# Patient Record
Sex: Female | Born: 1965
Health system: Southern US, Community
[De-identification: ages and names within clinical notes are randomized; demographics above are authoritative.]

## PROBLEM LIST (undated history)

## (undated) DIAGNOSIS — F32A Depression, unspecified: Secondary | ICD-10-CM

## (undated) DIAGNOSIS — Z8719 Personal history of other diseases of the digestive system: Secondary | ICD-10-CM

## (undated) DIAGNOSIS — R112 Nausea with vomiting, unspecified: Secondary | ICD-10-CM

## (undated) DIAGNOSIS — M1990 Unspecified osteoarthritis, unspecified site: Secondary | ICD-10-CM

## (undated) DIAGNOSIS — M542 Cervicalgia: Secondary | ICD-10-CM

## (undated) DIAGNOSIS — L709 Acne, unspecified: Secondary | ICD-10-CM

## (undated) DIAGNOSIS — Z87442 Personal history of urinary calculi: Secondary | ICD-10-CM

## (undated) DIAGNOSIS — M5126 Other intervertebral disc displacement, lumbar region: Secondary | ICD-10-CM

## (undated) DIAGNOSIS — Z973 Presence of spectacles and contact lenses: Secondary | ICD-10-CM

## (undated) DIAGNOSIS — M51369 Other intervertebral disc degeneration, lumbar region without mention of lumbar back pain or lower extremity pain: Secondary | ICD-10-CM

## (undated) DIAGNOSIS — M5136 Other intervertebral disc degeneration, lumbar region: Secondary | ICD-10-CM

## (undated) DIAGNOSIS — K631 Perforation of intestine (nontraumatic): Secondary | ICD-10-CM

## (undated) DIAGNOSIS — T7840XA Allergy, unspecified, initial encounter: Secondary | ICD-10-CM

## (undated) DIAGNOSIS — K219 Gastro-esophageal reflux disease without esophagitis: Secondary | ICD-10-CM

## (undated) DIAGNOSIS — M549 Dorsalgia, unspecified: Secondary | ICD-10-CM

## (undated) DIAGNOSIS — Z9889 Other specified postprocedural states: Secondary | ICD-10-CM

## (undated) DIAGNOSIS — J181 Lobar pneumonia, unspecified organism: Secondary | ICD-10-CM

## (undated) DIAGNOSIS — S83249A Other tear of medial meniscus, current injury, unspecified knee, initial encounter: Secondary | ICD-10-CM

## (undated) DIAGNOSIS — F419 Anxiety disorder, unspecified: Secondary | ICD-10-CM

## (undated) HISTORY — DX: Cervicalgia: M54.2

## (undated) HISTORY — DX: Acne, unspecified: L70.9

## (undated) HISTORY — PX: LITHOTRIPSY: SUR834

## (undated) HISTORY — PX: EXPLORATORY LAPAROTOMY: SUR591

## (undated) HISTORY — PX: OTHER SURGICAL HISTORY: SHX169

## (undated) HISTORY — PX: FRACTURE SURGERY: SHX138

## (undated) HISTORY — DX: Allergy, unspecified, initial encounter: T78.40XA

## (undated) HISTORY — DX: Anxiety disorder, unspecified: F41.9

## (undated) HISTORY — PX: ROTATOR CUFF REPAIR: SHX139

## (undated) HISTORY — DX: Perforation of intestine (nontraumatic): K63.1

## (undated) HISTORY — PX: ABDOMINAL HYSTERECTOMY: SHX81

## (undated) HISTORY — DX: Depression, unspecified: F32.A

## (undated) HISTORY — DX: Dorsalgia, unspecified: M54.9

## (undated) HISTORY — PX: APPENDECTOMY: SHX54

## (undated) HISTORY — PX: SPINE SURGERY: SHX786

## (undated) HISTORY — DX: Lobar pneumonia, unspecified organism: J18.1

## (undated) HISTORY — PX: SMALL INTESTINE SURGERY: SHX150

---

## 1997-10-27 ENCOUNTER — Other Ambulatory Visit: Admission: RE | Admit: 1997-10-27 | Discharge: 1997-10-27 | Payer: Self-pay | Admitting: Dermatology

## 1997-12-02 ENCOUNTER — Other Ambulatory Visit: Admission: RE | Admit: 1997-12-02 | Discharge: 1997-12-02 | Payer: Self-pay | Admitting: Dermatology

## 1998-02-10 ENCOUNTER — Other Ambulatory Visit: Admission: RE | Admit: 1998-02-10 | Discharge: 1998-02-10 | Payer: Self-pay | Admitting: Obstetrics and Gynecology

## 1998-08-07 ENCOUNTER — Inpatient Hospital Stay (HOSPITAL_COMMUNITY): Admission: RE | Admit: 1998-08-07 | Discharge: 1998-08-09 | Payer: Self-pay | Admitting: Obstetrics and Gynecology

## 1999-03-02 ENCOUNTER — Other Ambulatory Visit: Admission: RE | Admit: 1999-03-02 | Discharge: 1999-03-02 | Payer: Self-pay | Admitting: Obstetrics and Gynecology

## 2000-03-28 ENCOUNTER — Other Ambulatory Visit: Admission: RE | Admit: 2000-03-28 | Discharge: 2000-03-28 | Payer: Self-pay | Admitting: Obstetrics and Gynecology

## 2002-01-27 ENCOUNTER — Encounter: Payer: Self-pay | Admitting: Urology

## 2002-01-27 ENCOUNTER — Encounter: Admission: RE | Admit: 2002-01-27 | Discharge: 2002-01-27 | Payer: Self-pay | Admitting: Urology

## 2002-01-28 ENCOUNTER — Encounter: Admission: RE | Admit: 2002-01-28 | Discharge: 2002-01-28 | Payer: Self-pay | Admitting: Urology

## 2002-01-28 ENCOUNTER — Encounter: Payer: Self-pay | Admitting: Urology

## 2002-05-14 ENCOUNTER — Other Ambulatory Visit: Admission: RE | Admit: 2002-05-14 | Discharge: 2002-05-14 | Payer: Self-pay | Admitting: Internal Medicine

## 2003-11-14 ENCOUNTER — Emergency Department (HOSPITAL_COMMUNITY): Admission: EM | Admit: 2003-11-14 | Discharge: 2003-11-14 | Payer: Self-pay | Admitting: Emergency Medicine

## 2004-06-22 ENCOUNTER — Ambulatory Visit: Payer: Self-pay | Admitting: Internal Medicine

## 2004-07-06 ENCOUNTER — Ambulatory Visit: Payer: Self-pay | Admitting: Internal Medicine

## 2004-07-11 ENCOUNTER — Encounter: Admission: RE | Admit: 2004-07-11 | Discharge: 2004-07-11 | Payer: Self-pay | Admitting: Internal Medicine

## 2004-07-25 ENCOUNTER — Ambulatory Visit (HOSPITAL_COMMUNITY): Admission: RE | Admit: 2004-07-25 | Discharge: 2004-07-25 | Payer: Self-pay | Admitting: Urology

## 2004-07-26 ENCOUNTER — Ambulatory Visit (HOSPITAL_BASED_OUTPATIENT_CLINIC_OR_DEPARTMENT_OTHER): Admission: RE | Admit: 2004-07-26 | Discharge: 2004-07-26 | Payer: Self-pay | Admitting: Urology

## 2004-08-06 ENCOUNTER — Ambulatory Visit (HOSPITAL_COMMUNITY): Admission: RE | Admit: 2004-08-06 | Discharge: 2004-08-06 | Payer: Self-pay | Admitting: Urology

## 2005-04-08 ENCOUNTER — Other Ambulatory Visit: Admission: RE | Admit: 2005-04-08 | Discharge: 2005-04-08 | Payer: Self-pay | Admitting: Obstetrics and Gynecology

## 2005-04-25 ENCOUNTER — Observation Stay (HOSPITAL_COMMUNITY): Admission: RE | Admit: 2005-04-25 | Discharge: 2005-04-26 | Payer: Self-pay | Admitting: Obstetrics and Gynecology

## 2005-04-25 ENCOUNTER — Encounter (INDEPENDENT_AMBULATORY_CARE_PROVIDER_SITE_OTHER): Payer: Self-pay | Admitting: Specialist

## 2005-04-27 ENCOUNTER — Inpatient Hospital Stay (HOSPITAL_COMMUNITY): Admission: AD | Admit: 2005-04-27 | Discharge: 2005-04-27 | Payer: Self-pay | Admitting: Obstetrics and Gynecology

## 2005-09-19 ENCOUNTER — Encounter: Admission: RE | Admit: 2005-09-19 | Discharge: 2005-09-19 | Payer: Self-pay | Admitting: Family Medicine

## 2005-10-01 ENCOUNTER — Encounter: Admission: RE | Admit: 2005-10-01 | Discharge: 2005-10-01 | Payer: Self-pay | Admitting: Family Medicine

## 2006-05-13 HISTORY — PX: OTHER SURGICAL HISTORY: SHX169

## 2006-10-14 ENCOUNTER — Ambulatory Visit: Payer: Self-pay | Admitting: Internal Medicine

## 2007-09-11 LAB — HM MAMMOGRAPHY: HM Mammogram: NORMAL

## 2008-07-30 ENCOUNTER — Ambulatory Visit: Payer: Self-pay | Admitting: Family Medicine

## 2008-12-30 ENCOUNTER — Ambulatory Visit: Payer: Self-pay | Admitting: Internal Medicine

## 2008-12-30 DIAGNOSIS — K219 Gastro-esophageal reflux disease without esophagitis: Secondary | ICD-10-CM | POA: Insufficient documentation

## 2008-12-30 DIAGNOSIS — N809 Endometriosis, unspecified: Secondary | ICD-10-CM | POA: Insufficient documentation

## 2008-12-30 DIAGNOSIS — R1310 Dysphagia, unspecified: Secondary | ICD-10-CM | POA: Insufficient documentation

## 2009-01-13 ENCOUNTER — Ambulatory Visit: Payer: Self-pay | Admitting: Gastroenterology

## 2009-01-13 DIAGNOSIS — F3289 Other specified depressive episodes: Secondary | ICD-10-CM | POA: Insufficient documentation

## 2009-01-13 DIAGNOSIS — K5900 Constipation, unspecified: Secondary | ICD-10-CM | POA: Insufficient documentation

## 2009-01-13 DIAGNOSIS — R143 Flatulence: Secondary | ICD-10-CM

## 2009-01-13 DIAGNOSIS — R142 Eructation: Secondary | ICD-10-CM

## 2009-01-13 DIAGNOSIS — R141 Gas pain: Secondary | ICD-10-CM | POA: Insufficient documentation

## 2009-01-13 DIAGNOSIS — F329 Major depressive disorder, single episode, unspecified: Secondary | ICD-10-CM | POA: Insufficient documentation

## 2009-01-13 DIAGNOSIS — R1319 Other dysphagia: Secondary | ICD-10-CM | POA: Insufficient documentation

## 2009-01-13 DIAGNOSIS — R110 Nausea: Secondary | ICD-10-CM | POA: Insufficient documentation

## 2009-01-13 DIAGNOSIS — F411 Generalized anxiety disorder: Secondary | ICD-10-CM | POA: Insufficient documentation

## 2009-01-13 DIAGNOSIS — Z87442 Personal history of urinary calculi: Secondary | ICD-10-CM | POA: Insufficient documentation

## 2009-01-30 ENCOUNTER — Ambulatory Visit: Payer: Self-pay | Admitting: Gastroenterology

## 2009-01-30 ENCOUNTER — Encounter: Payer: Self-pay | Admitting: Gastroenterology

## 2009-02-01 ENCOUNTER — Encounter: Payer: Self-pay | Admitting: Gastroenterology

## 2009-09-20 ENCOUNTER — Telehealth (INDEPENDENT_AMBULATORY_CARE_PROVIDER_SITE_OTHER): Payer: Self-pay | Admitting: *Deleted

## 2009-09-22 ENCOUNTER — Telehealth: Payer: Self-pay | Admitting: Gastroenterology

## 2010-06-14 NOTE — Progress Notes (Signed)
  Phone Note Outgoing Call   Call placed by: Ok Anis CMA,  Sep 22, 2009 8:54 AM Call placed to: Insurer Summary of Call: I received via fax prior authorization for patient to have Dexilant....Marland KitchenMarland KitchenI called [employer] at [phone] and spoke with Alexia Freestone and she stated that patients insurance was terminated on 07-10-2009...Marland KitchenMarland KitchenI tried to reach patient with this information, left message for patient to call office
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
## 2010-06-14 NOTE — Progress Notes (Signed)
Summary: Refill Dexilant  Phone Note From Pharmacy   Caller: [facility] # [id number]* Summary of Call: Refill requested for Dexilant. Initial call taken by: Ashok Cordia RN,  Sep 20, 2009 4:10 PM    Prescriptions: DEXILANT 60 MG CPDR (DEXLANSOPRAZOLE) take one by mouth once daily  #30 x 6   Entered by:   Ashok Cordia RN   Authorized by:   Mardella Layman MD [facility]   Signed by:   Ashok Cordia RN on 09/20/2009   Method used:   Electronically to        [facility] # [id number]* (retail)       [address]       [place], [place]  [address]       Ph: [phone]       Fax: [phone]   RxID:   [id number]

## 2010-06-14 NOTE — Assessment & Plan Note (Signed)
Summary: SORE THROAT/SCM   Vital Signs:  Patient profile:   45 year old female Weight:      175 pounds Temp:     97.9 degrees F oral Pulse rate:   76 / minute BP sitting:   110 / 78  (left arm) Cuff size:   large  Vitals Entered By: Shonna Chock (July 30, 2008 9:51 AM) Comments SORE THROAT   History of Present Illness: 45 year old female presents   ST laryngitis coughing earache Taking a lot of Dayquil No fever Hurts to drink anything Ate yesterday  REVIEW OF SYSTEMS GEN: No acute illnesses, no fever, chills, sweats. CV: No chest pain or SOB GI: No noted N or V Otherwise, pertinent positives and negatives are noted in the HPI. Additional ROS may be included in the Centricity ROS section, but if not, then this constitutes the ROS.   Gen: WDWN, NAD; A & O x3, cooperative. Pleasant.Globally Non-toxic HEENT: Normocephalic and atraumatic. Throat clear, w/o exudate, R TM clear, L TM - good landmarks, No fluid present. no  rhinnorhea. No frontal or maxillary sinus T. MMM NECK: Anterior cervical  LAD is absent CV: RRR, No M/G/R, cap refill <2 sec PULM: Breathing comfortably in no respiratory distress. no wheezing, crackles, rhonchi ABD: S,NT,ND,+BS. No HSM. No rebound. EXT: No c/c/e PSYCH: Friendly, good eye contact MSK: Nml gait   Allergies (verified): No Known Drug Allergies   Impression & Recommendations:  Problem # 1:  LARYNGITIS-ACUTE (ICD-464.00) Assessment New Discussed laryngitis almost always viral  Supportive care, fluids, sleep, Dayquil, Nyquil Magic Mouthwash as needed   Strep neg  Complete Medication List: 1)  Magic Mouthwash  .... 1 tsp gargle as needed sore throat. mix 80 cc each lidocaine 2% viscous, maalox, benadryl susp.  Other Orders: Rapid Strep (40981) Prescriptions: MAGIC MOUTHWASH 1 tsp gargle as needed sore throat. Mix 80 cc each Lidocaine 2% viscous, Maalox, Benadryl susp.  #240 cc x 0   Entered and Authorized by:   Hannah Beat  MD   Signed by:   Hannah Beat MD on 07/30/2008   Method used:   Print then Give to Patient   RxID:   [id number]

## 2010-06-14 NOTE — Procedures (Signed)
Summary: Endoscopy   EGD  Procedure date:  01/30/2009  Findings:      Location: [facility]   ENDOSCOPY PROCEDURE REPORT  PATIENT:  [name] [name], [name] [name]  MR#:  [id number] BIRTHDATE:   [birth year], 45 yrs. old   GENDER:   female  ENDOSCOPIST:   Vania Rea. Jarold Motto, MD, [facility] Referred by:   PROCEDURE DATE:  01/30/2009 PROCEDURE:  EGD with biopsy, Elease Hashimoto Dilation of Esophagus ASA CLASS:   Class I INDICATIONS: REFLUX AND A GLOBUS SENSATION.  MEDICATIONS:    Fentanyl 50 mcg IV, Versed 5 mg IV TOPICAL ANESTHETIC:   Exactacain Spray  DESCRIPTION OF PROCEDURE:   After the risks benefits and alternatives of the procedure were thoroughly explained, informed consent was obtained.  The [facility] [id number]-H180 [id number] endoscope was introduced through the mouth and advanced to the second portion of the duodenum, without limitations.  The instrument was slowly withdrawn as the mucosa was fully examined. <<PROCEDUREIMAGES>>      <<OLD IMAGES>>  A hiatal hernia was found. 4CM HH NOTED.NO DEFINITE STRICTURE.#89F MALONEY DILATOR PASSED WITHOUT DIFFICULTY.  The duodenal bulb was normal in appearance, as was the postbulbar duodenum.  Normal GE junction was noted.  The esophagus and gastroesophageal junction were completely normal in appearance. RANDOM BIOPSIES DONE.  The stomach was entered and closely examined. The antrum, angularis, and lesser curvature were well visualized, including a retroflexed view of the cardia and fundus. The stomach wall was normally distensable. The scope passed easily through the pylorus into the duodenum.    Retroflexed views revealed a hiatal hernia.    The scope was then withdrawn from the patient and the procedure completed.  COMPLICATIONS:   None  ENDOSCOPIC IMPRESSION:  1) Hiatal hernia  2) Normal duodenum  3) Normal GE junction  4) Normal esophagus  5) Normal stomach  R/O EOSINOPHILIC ESOPHAGITIS VS. OCCULT STRICTURE FROM GERD. RECOMMENDATIONS:  1) await  biopsy results  2) continue current medications  CONSIDER MANOMETRY AND 24H pH STUDY.  REPEAT EXAM:   No   _______________________________ Vania Rea. Jarold Motto, MD, [facility]    CC: Madelin Headings, MD       REPORT OF SURGICAL PATHOLOGY   Case #: [id number] Patient Name: [name] [name], [name] [name]. Office Chart Number:  N/A [id number] MRN: [id number] Pathologist: Beulah Gandy. Luisa Hart, MD DOB/Age  October 18, 1965 (Age: 45)    Gender: F Date Taken:  01/30/2009 Date Received: 01/31/2009   FINAL DIAGNOSIS   ***MICROSCOPIC EXAMINATION AND DIAGNOSIS***   ESOPHAGUS, BIOPSIES:  BENIGN ESOPHAGEAL MUCOSA.  NO FEATURES OF EOSINOPHILIC ESOPHAGITIS, INTESTINAL METAPLASIA, DYSPLASIA OR MALIGNANCY IDENTIFIED.   COMMENT An Alcian Blue stain is performed to determine the presence of intestinal metaplasia (goblet cell metaplasia). No intestinal metaplasia (goblet cell metaplasia) is identified with the Alcian Blue stain. The control stained appropriately.  (JDP:kv 02-01-09)   kv Date Reported:  02/01/2009     Beulah Gandy. Luisa Hart, MD *** Electronically Signed Out By JDP ***     February 01, 2009 MRN: [id number]    [facility] [address] [place], [place]  [address]    Dear Ms. Jaskot,  I am pleased to inform you that the biopsies taken during your recent endoscopic examination did not show any evidence of cancer upon pathologic examination.  Additional information/recommendations:  __No further action is needed at this time.  Please follow-up with      your primary care physician for your other healthcare needs.  __ Please call [phone] to schedule a return visit to review  your condition.  _XX_ Continue with the treatment plan as outlined on the day of your      exam.  __ You should have a repeat endoscopic examination for this problem              in _ months/years.   Please call us if you are having persistent problems or have questions about your condition that have not been fully  answered at this time.  Sincerely,  Mardella Layman MD [facility]  This letter has been electronically signed by your physician.   This report was created from the original endoscopy report, which was reviewed and signed by the above listed endoscopist.

## 2010-06-14 NOTE — Letter (Signed)
Summary: Patient [facility] Biopsy Results  [facility]  [address] [place], [place] [address]   Phone: [phone]  Fax: [phone]        February 01, 2009 MRN: [id number]    [facility] [address] [place], [place]  [address]    Dear Ms. Clagg,  I am pleased to inform you that the biopsies taken during your recent endoscopic examination did not show any evidence of cancer upon pathologic examination.  Additional information/recommendations:  __No further action is needed at this time.  Please follow-up with      your primary care physician for your other healthcare needs.  __ Please call [phone] to schedule a return visit to review      your condition.  _XX_ Continue with the treatment plan as outlined on the day of your      exam.  __ You should have a repeat endoscopic examination for this problem              in _ months/years.   Please call us if you are having persistent problems or have questions about your condition that have not been fully answered at this time.  Sincerely,  Mardella Layman MD [facility]  This letter has been electronically signed by your physician.
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
## 2010-07-12 ENCOUNTER — Encounter (HOSPITAL_COMMUNITY)
Admission: RE | Admit: 2010-07-12 | Discharge: 2010-07-12 | Disposition: A | Discharge status: Home / Self Care | Payer: BC Managed Care – PPO | Source: Ambulatory Visit | Attending: Obstetrics and Gynecology | Admitting: Obstetrics and Gynecology

## 2010-07-12 LAB — CBC
HCT: 40 % (ref 36.0–46.0)
Hemoglobin: 13.1 g/dL (ref 12.0–15.0)
MCH: 32.3 pg (ref 26.0–34.0)
MCHC: 32.8 g/dL (ref 30.0–36.0)
MCV: 98.5 fL (ref 78.0–100.0)
Platelets: 180 10*3/uL (ref 150–400)
RBC: 4.06 MIL/uL (ref 3.87–5.11)
RDW: 11.8 % (ref 11.5–15.5)
WBC: 6.1 10*3/uL (ref 4.0–10.5)

## 2010-07-12 LAB — SURGICAL PCR SCREEN
MRSA, PCR: NEGATIVE
Staphylococcus aureus: NEGATIVE

## 2010-07-18 ENCOUNTER — Ambulatory Visit (HOSPITAL_COMMUNITY)
Admission: RE | Admit: 2010-07-18 | Discharge: 2010-07-18 | Disposition: A | Discharge status: Home / Self Care | Payer: BC Managed Care – PPO | Source: Ambulatory Visit | Attending: Obstetrics and Gynecology | Admitting: Obstetrics and Gynecology

## 2010-07-18 ENCOUNTER — Other Ambulatory Visit: Payer: Self-pay | Admitting: Obstetrics and Gynecology

## 2010-07-18 DIAGNOSIS — Z01812 Encounter for preprocedural laboratory examination: Secondary | ICD-10-CM | POA: Insufficient documentation

## 2010-07-18 DIAGNOSIS — N949 Unspecified condition associated with female genital organs and menstrual cycle: Secondary | ICD-10-CM | POA: Insufficient documentation

## 2010-07-18 DIAGNOSIS — Z01818 Encounter for other preprocedural examination: Secondary | ICD-10-CM | POA: Insufficient documentation

## 2010-07-18 DIAGNOSIS — Z90710 Acquired absence of both cervix and uterus: Secondary | ICD-10-CM | POA: Insufficient documentation

## 2010-07-23 NOTE — H&P (Signed)
  [name] [name]               ACCOUNT NO.:  [id number]  MEDICAL RECORD NO.:  [id number]        PATIENT TYPE:  WAMB  LOCATION:                                [facility]  [facility]  PHYSICIAN:  [name] [name]. [name] [name], CarrDATE OF BIRTH:  [birth year]  DATE OF ADMISSION: DATE OF DISCHARGE:                             HISTORY & PHYSICAL   DATE OF SCHEDULED SURGERY:  July 17, 2010  CHIEF COMPLAINT:  Chronic pelvic pain.  HISTORY OF PRESENT ILLNESS:  A 45 year old who has had a prior TAH followed on a separate occasion by laparoscopic LSO.  The last procedure was December 2006, when she had diagnostic laparoscopy with laparoscopic LSO and lysis of adhesions.  The right ovary appeared to be normal at that time.  Over the past 6 months, she has developed worsening problems with right lower quadrant pain and presents now for definitive RSO.  The procedure including risk of bleeding, infection, the possibility of open surgery to complete by laparotomy, ERT along with her expected recovery time all reviewed with her which she understands and accepts.  Ultrasound dated June 06, 2010 showed no free fluid and no enlargements noted.  The most likely possibility is periadnexal adhesions on that side.  PAST MEDICAL HISTORY:  ALLERGIES:  ZOLOFT.  CURRENT MEDICATIONS:  Evamist, Vivelle-Dot, Pepcid p.r.n.  PAST SURGICAL HISTORY:  Hysterectomy in 2000, LSO in 2006.  She has had 2 vaginal deliveries, lithotripsy, upper endoscopy, and esophageal dilatation.  FAMILY HISTORY:  Significant for heart disease, anemia, thyroid disease, gallbladder disease, arthritis, hypertension, and unspecified cancer.  SOCIAL HISTORY:  Dr. Fabian [name] is her medical doctor.  She is married. Denies tobacco or drug use, one alcoholic drink per day.  PHYSICAL EXAMINATION:  VITAL SIGNS:  Temperature 98.2, blood pressure 120/78. HEENT:  Unremarkable. NECK:  Supple, without masses. LUNGS:  Clear. CARDIOVASCULAR:  Regular,  rate and rhythm without murmurs, rubs or gallops. BREASTS:  Without masses. ABDOMEN:  Soft and nontender. PELVIC:  Normal external genitalia.  High vaginal swab is clear. Vaginal cuff intact.  Bimanual negative.  No unusual masses or tenderness.  IMPRESSION:  Chronic pelvic pain, prior TAH/LSO on separate occasions.  PLAN:  DO with RSO, possible EO with RSO.  Procedure and risks reviewed as above.     [name] [name] M. [name] [name], M.D.     RMH/MEDQ  D:  07/17/2010  T:  07/17/2010  Job:  [id number]  Electronically Signed by [name] [name] M.D. on 07/23/2010 08:40:14 PM

## 2010-07-23 NOTE — Op Note (Signed)
  [name] [name], [name] [name]               ACCOUNT NO.:  [id number]  MEDICAL RECORD NO.:  [id number]           PATIENT TYPE:  O  LOCATION:  [facility]                          [facility]ILITY:  [facility]  PHYSICIAN:  Duke Salvia. Marcelle Overlie, M.D.DATE OF BIRTH:  1965-05-22  DATE OF PROCEDURE:  07/18/2010 DATE OF DISCHARGE:  07/18/2010                              OPERATIVE REPORT   PREOPERATIVE DIAGNOSIS:  Chronic pelvic pain.  POSTOPERATIVE DIAGNOSIS:  Chronic pelvic pain, right adnexal adhesions.  PROCEDURE:  Diagnostic laparoscopy, right salpingo-oophorectomy.  SURGEON:  Duke Salvia. Marcelle Overlie, MD  ANESTHESIA:  General.  COMPLICATIONS:  None.  DRAINS:  Foley catheter.  BLOOD LOSS:  Minimal.  PROCEDURE AND FINDINGS:  The patient was taken to the operating room. After an adequate level of general endotracheal anesthesia was obtained with the patient's legs in stirrups, the abdomen was prepped and draped in usual manner for sterile abdominal procedures.  Foley catheter was positioned draining clear urine.  The subumbilical area was infiltrated with 0.25% Marcaine plain.  A small incision was made, the Veress needle was introduced without difficulty.  Its intra-abdominal position verified by pressure and water testing.  After 2-1/2 liter pneumoperitoneum was then created, laparoscopic trocar and sleeve were then introduced without difficulty.  A 10/12 bladed trocar was inserted three fingerbreadths above the symphysis in the midline under direct visualization.  The patient was placed in Trendelenburg, and the pelvic findings as follows: Upper abdomen unremarkable.  In the pelvis, the uterus and left ovary were surgically absent.  The right ovary was normal in size, had some moderate adhesions to the right pelvic sidewall.  A grasping instrument was used to grasp the tube and ovary, placed it on traction toward the midline.  The course of the ureter was evaluated and noted to be well below.  The EnSeal  device was used to coagulate and divide the right IP ligament down to and including the right round ligament.  With the right tube and ovary still on traction toward the midline, the course of the ureter was reassessed and noted to be well below, staying close to the ovary.  The remaining adhesions were coagulated and divided with EnSeal. The specimen was then removed in two pieces through the lower incision and sent to pathology.  Excellent hemostasis once the specimen was removed, the course of the ureter was observed again and noted to be well below the operative site.  Instruments were removed, gas allowed to escape.  Defects were closed with 4-0 Dexon subcuticular sutures and Dermabond.  She tolerated this well, went to recovery room in good condition.     [name] M. Marcelle Overlie, M.D.     RMH/MEDQ  D:  07/18/2010  T:  07/18/2010  Job:  [id number]  Electronically Signed by Richarda Overlie M.D. on 07/23/2010 08:40:18 PM

## 2010-07-29 ENCOUNTER — Other Ambulatory Visit: Payer: Self-pay | Admitting: General Surgery

## 2010-07-29 ENCOUNTER — Inpatient Hospital Stay (HOSPITAL_COMMUNITY): Payer: BC Managed Care – PPO

## 2010-07-29 ENCOUNTER — Inpatient Hospital Stay (HOSPITAL_COMMUNITY)
Admission: AD | Admit: 2010-07-29 | Discharge: 2010-07-29 | Disposition: A | Discharge status: Other Acute Inpatient Hospital | Payer: BC Managed Care – PPO | Source: Ambulatory Visit | Attending: Obstetrics and Gynecology | Admitting: Obstetrics and Gynecology

## 2010-07-29 ENCOUNTER — Inpatient Hospital Stay (HOSPITAL_COMMUNITY)
Admission: EM | Admit: 2010-07-29 | Discharge: 2010-08-09 | DRG: 585 | Disposition: A | Discharge status: Home / Self Care | Payer: BC Managed Care – PPO | Attending: Pulmonary Disease | Admitting: Pulmonary Disease

## 2010-07-29 DIAGNOSIS — K37 Unspecified appendicitis: Secondary | ICD-10-CM | POA: Diagnosis present

## 2010-07-29 DIAGNOSIS — K389 Disease of appendix, unspecified: Secondary | ICD-10-CM | POA: Diagnosis present

## 2010-07-29 DIAGNOSIS — K219 Gastro-esophageal reflux disease without esophagitis: Secondary | ICD-10-CM | POA: Diagnosis present

## 2010-07-29 DIAGNOSIS — K631 Perforation of intestine (nontraumatic): Principal | ICD-10-CM | POA: Diagnosis present

## 2010-07-29 DIAGNOSIS — K658 Other peritonitis: Secondary | ICD-10-CM | POA: Diagnosis present

## 2010-07-29 DIAGNOSIS — R1031 Right lower quadrant pain: Secondary | ICD-10-CM | POA: Insufficient documentation

## 2010-07-29 DIAGNOSIS — Z90710 Acquired absence of both cervix and uterus: Secondary | ICD-10-CM

## 2010-07-29 LAB — COMPREHENSIVE METABOLIC PANEL
ALT: 31 U/L (ref 0–35)
AST: 22 U/L (ref 0–37)
Albumin: 4.3 g/dL (ref 3.5–5.2)
Alkaline Phosphatase: 44 U/L (ref 39–117)
BUN: 15 mg/dL (ref 6–23)
CO2: 25 mEq/L (ref 19–32)
Calcium: 9.4 mg/dL (ref 8.4–10.5)
Chloride: 108 mEq/L (ref 96–112)
Creatinine, Ser: 0.74 mg/dL (ref 0.4–1.2)
GFR calc Af Amer: >60 mL/min (ref >60)
GFR calc non Af Amer: >60 mL/min (ref >60)
Glucose, Bld: 103 mg/dL — ABNORMAL HIGH (ref 70–99)
Potassium: 3.8 mEq/L (ref 3.5–5.1)
Sodium: 140 mEq/L (ref 135–145)
Total Bilirubin: 0.3 mg/dL (ref 0.3–1.2)
Total Protein: 7.2 g/dL (ref 6.0–8.3)

## 2010-07-29 LAB — CBC
HCT: 42.6 % (ref 36.0–46.0)
Hemoglobin: 14.4 g/dL (ref 12.0–15.0)
MCH: 32.5 pg (ref 26.0–34.0)
MCHC: 33.8 g/dL (ref 30.0–36.0)
MCV: 96.2 fL (ref 78.0–100.0)
Platelets: 182 10*3/uL (ref 150–400)
RBC: 4.43 MIL/uL (ref 3.87–5.11)
RDW: 11.7 % (ref 11.5–15.5)
WBC: 18.5 10*3/uL — ABNORMAL HIGH (ref 4.0–10.5)

## 2010-07-29 MED ORDER — IOHEXOL 300 MG/ML  SOLN
100.0000 mL | Freq: Once | INTRAMUSCULAR | Status: AC | PRN
  Administered 2010-07-29: 100 mL via INTRAVENOUS

## 2010-07-30 LAB — BASIC METABOLIC PANEL
BUN: 8 mg/dL (ref 6–23)
CO2: 28 mEq/L (ref 19–32)
Calcium: 7.8 mg/dL — ABNORMAL LOW (ref 8.4–10.5)
Chloride: 108 mEq/L (ref 96–112)
Creatinine, Ser: 0.81 mg/dL (ref 0.4–1.2)
GFR calc Af Amer: >60 mL/min (ref >60)
GFR calc non Af Amer: >60 mL/min (ref >60)
Glucose, Bld: 155 mg/dL — ABNORMAL HIGH (ref 70–99)
Potassium: 3.9 mEq/L (ref 3.5–5.1)
Sodium: 140 mEq/L (ref 135–145)

## 2010-07-30 LAB — CBC
HCT: 33.9 % — ABNORMAL LOW (ref 36.0–46.0)
Hemoglobin: 11.4 g/dL — ABNORMAL LOW (ref 12.0–15.0)
MCH: 32.9 pg (ref 26.0–34.0)
MCHC: 33.6 g/dL (ref 30.0–36.0)
MCV: 97.7 fL (ref 78.0–100.0)
Platelets: 161 10*3/uL (ref 150–400)
RBC: 3.47 MIL/uL — ABNORMAL LOW (ref 3.87–5.11)
RDW: 11.8 % (ref 11.5–15.5)
WBC: 19.5 10*3/uL — ABNORMAL HIGH (ref 4.0–10.5)

## 2010-07-30 NOTE — H&P (Signed)
  NAMEJEANELL, [name] [name]               ACCOUNT NO.:  [id number]  MEDICAL RECORD NO.:  [id number]          PATIENT TYPE:  LOCATION:                                 FACILITY:  PHYSICIAN:  Duke Salvia. Marcelle Overlie, M.D.DATE OF BIRTH:  August 17, 1965  DATE OF ADMISSION: DATE OF DISCHARGE:                             HISTORY & PHYSICAL   PRIMARY CARE PHYSICIAN:  Neta Mends. Panosh, MD  CHIEF COMPLAINT:  Chronic right lower quadrant pain.  HISTORY OF PRESENT ILLNESS:  A 45 year old G2, P2.  In 2000, she had a TVH and in 2006, she had diagnostic laparoscopy, lysis of adhesions and LSO.  The right tube and ovary appeared to be normal at that time.  In 2009, her FSH was 101 and she has been on estradiol 1 mg daily for vasomotor symptoms.  Over the last 6 months, has noted worsening problems with right lower quadrant pain.  Recent ultrasound in our office on June 06, 2010, demonstrated no enlargement of the right ovary.  Due to chronic pain, presents for laparoscopic RSO.  PAST MEDICAL HISTORY:  ALLERGIES:  Zoloft.  CURRENT MEDICATIONS:  Meloxicam for lower back pain, OTC Pepcid, and estradiol 1 mg daily.  SURGICAL HISTORY:  She had two vaginal births, TVH in 2000, [place] in 2006. Lithotripsy on two occasions and has had laparoscopy for laser surgery and in 90s for pelvic endometriosis.  FAMILY HISTORY:  Significant for thyroid cancer and migraine headache.  SOCIAL HISTORY:  Denies tobacco or drug use.  She does have two alcoholic drinks per week.  She is married.  PHYSICAL EXAMINATION:  VITAL SIGNS:  Temperature 98.2, blood pressure 120/78. HEENT:  Unremarkable. NECK:  Supple without masses. LUNGS:  Clear. CARDIOVASCULAR:  Rate and rhythm without murmurs, rubs, or gallops. BREASTS:  Without masses. ABDOMEN:  Soft, flat, nontender. PELVIC:  Normal external genitalia.  Vaginal cuff clear.  Bimanual revealed tenderness on the right.  No nodularity noted. EXTREMITIES:   Unremarkable. NEUROLOGIC:  Unremarkable.  IMPRESSION:  Chronic pelvic pain, prior total vaginal hysterectomy, left salpingo-oophorectomy.  PLAN:  Laparoscopic RSO.  Procedure and risks reviewed as above.     Jadarion Halbig M. Marcelle Overlie, M.D.     RMH/MEDQ  D:  06/29/2010  T:  06/29/2010  Job:  [id number]  Electronically Signed by Richarda Overlie M.D. on 07/30/2010 09:26:51 AM

## 2010-07-30 NOTE — Op Note (Signed)
[name] [name], [name] [name]               ACCOUNT NO.:  [id number]  MEDICAL RECORD NO.:  [id number]           PATIENT TYPE:  I  LOCATION:  1235                         FACILITY:  [facility]  PHYSICIAN:  Angelia Mould. Derrell Lolling, M.D.DATE OF BIRTH:  1965/12/27  DATE OF PROCEDURE:  07/29/2010 DATE OF DISCHARGE:                              OPERATIVE REPORT   PREOPERATIVE DIAGNOSES:  Acute peritonitis, suspect small bowel perforation.  POSTOPERATIVE DIAGNOSES: 1. Perforated small bowel with fecal peritonitis. 2. Secondary inflammation of appendix with multiple appendicolith.  OPERATION PERFORMED: 1. Diagnostic laparoscopy. 2. Laparotomy with closure of ileal perforation. 3. Indicated appendectomy.  SURGEON:  Angelia Mould. Derrell Lolling, M.D.  FIRST ASSISTANT:  Abigail Miyamoto, M.D.  OPERATIVE INDICATIONS:  This is a 45 year old Caucasian female in fairly good health.  She has had a hysterectomy in the past, left salpingo- oophorectomy in the past, and lithotripsy for kidney stones.  She has had an upper endoscopy with dilatation for gastroesophageal reflux disease.  She underwent elective laparoscopic right salpingo- oophorectomy on July 18, 2010 by Dr. Marcelle Overlie, and that surgery was uneventful with minimal adhesions.  She did very well after that surgery, resuming normal diet, normal bowel function, and normal activities.  She went to bed feeling well last night and awoke this morning with severe lower abdominal pain, shaking chills, and nausea, vomiting.  She was evaluated at [facility] hospital in the middle of the day, where CT scan showed a fluid collection with air bubbles in the pelvis and extravasated contrast.  She had a white count of 18,000.  She had acute abdominal pain and tenderness with peritoneal signs.  I was called to see her and we transferred her to [facility] for surgical management.  OPERATIVE FINDINGS:  The patient had a pinpoint perforation of the antimesenteric border  of the ileum about 18-24 inches proximal to the ileocecal valve.  The surrounding bowel was perfectly viable.  There was no evidence of Crohns disease. There was no mass and no visible or palpable ulcer. There was enteric contamination throughout the pelvis.  There was acute inflammatory exudate around loops of bowel and small in the pelvis, but the upper abdomen, there was some minimal contamination in cloudy fluid.  The appendix was caught up in the inflammatory process, but only appeared to be secondarily inflamed.  The appendix had 2 large fecaliths in it and therefore we decided to do appendectomy because of the high risk for appendicitis in the future.  The rest of the small bowel from the ligament of Treitz to the ileocecal valve showed no evidence of any injury.  The right colon, descending colon, sigmoid colon, and rectum showed no evidence of any primary injury.  The operative site in the right pelvis, where the tube and ovary was looked fine.  There was no hematoma or abscess, there should appear to be healing uneventfully.  The liver and gallbladderlooked normal.  The stomach and duodenum felt normal.  There is no evidence of any ulcer.  The transverse colon felt normal as did the omentum.  OPERATIVE TECHNIQUE:  Following induction of general endotracheal anesthesia, a Foley catheter  and nasogastric tube were placed.  The abdomen was prepped and draped in the sterile fashion.  Intravenous antibiotics were given preop.  The patient was identified as correct patient and correct procedure.  A 5-mm optical trocar was placed in left upper quadrant under direct vision.  The insertion was uneventful and atraumatic.  Pneumoperitoneum was created.  Video cam was inserted.  We saw that the inflammatory process with exudate and greenish murky fluid was focally in the pelvis and we saw no abnormality of the liver or gallbladder or stomach.  We then abandoned the laparoscopic approach and  removed the trocar.  A lower midline incision was made.  The fascia was incised in the midline. The abdominal cavity was entered and explored with findings as described above.  We evacuated the enteric fluid.  We looked at the small bowel perforation and because it was so focal with such healthy bowel all around it.  We simply closed it with 3 interrupted sutures of 2-0 silk placed transversely.  After this was done, the closure was quite secure, the tissues looked pretty healthy and there was no evidence of any leak. We could make small bowel content prograde and retrograde and we could actually palpate the lumen.  We then spent some time probably irrigating 10 L of saline throughout the subphrenic spaces, abdomen, paracolic gutters, and pelvis until all the irrigation fluid was completely clear.  We saw no bleeding or other pathologic process.  The appendix was caught up in the pelvic inflammation and had 2 large palpable fecaliths.  I elected to remove the appendix because of this.  The appendiceal mesentery was clamped with hemostats and divided and ligated with 2-0 Vicryl ties.  I placed a purse-string suture of 3-0 silk around the base of the appendix.  I ligated the base of the appendix with 2-0 Vicryl tie and then amputated the appendix and removed it.  The appendiceal stump was then inverted and the purse-string suture of silk was tied, this provided a very secure inversion and closure and no further sutures were necessary.  We irrigated further and I felt everything looked good.  I chose to put some Tisseel on the small bowel repair and let that harden and then put the small bowel back in its anatomic position.  I draped the omentum down over the repair.  The midline fascia was then closed with a running suture of #1 double-stranded PDS.  The skin was closed loosely with a few staples, but mostly, I placed Telfa wicks between the staples to drain the wound.  Clean bandages were  placed and the patient taken to the recovery room in stable condition.  Estimated blood loss was about 50 cc.  Complications, none.  Sponge, needle, and instrument counts were correct.     Angelia Mould. Derrell Lolling, M.D.     HMI/MEDQ  D:  07/29/2010  T:  07/30/2010  Job:  [id number]  cc:   Duke Salvia. Marcelle Overlie, M.D. Fax: [phone]  Dr. Rica Records, MD Fax: [phone]  Electronically Signed by Claud Kelp M.D. on 07/30/2010 09:36:49 AM

## 2010-07-30 NOTE — H&P (Signed)
NAMEJAYLEAH, GARBERS               ACCOUNT NO.:  [id number]  MEDICAL RECORD NO.:  [id number]           PATIENT TYPE:  I  LOCATION:  1235                         FACILITY:  [facility]  PHYSICIAN:  Angelia Mould. Derrell Lolling, M.D.DATE OF BIRTH:  21-Oct-1965  DATE OF ADMISSION:  07/29/2010 DATE OF DISCHARGE:                             HISTORY & PHYSICAL   CHIEF COMPLAINT:  Abdominal pain, chills, and vomiting.  HISTORY OF PRESENT ILLNESS:  This is a 45 year old white female who underwent an uneventful laparoscopic right salpingo-oophorectomy by Dr. Marcelle Overlie on July 18, 2010.  She did very well; resuming normal activities, diet, and bowel function.  She went to bed feeling well last night and awoke this morning with severe lower abdominal pain, it has continued to be severe and persistent, she has had chills, she has vomited.  She went to the [facility], maternity admission, and was evaluated by Dr. Renaldo Fiddler.  After her evaluation, revealed findings suspicious for small bowel perforation, I was called, and the patient was transferred to [facility] for my management.  She is being evaluated in the [facility] Emergency Room.  PAST MEDICAL HISTORY: 1. Vaginal hysterectomy, year 2000. 2. Left salpingo-oophorectomy, approximately 2006. 3. Right salpingo-oophorectomy on July 18, 2010. 4. Lithotripsy for kidney stones. 5. Gastroesophageal reflux disease, status post upper endoscopy with     dilatation. 6. L4-L5 bulging disk.  CURRENT MEDICATIONS: 1. Estradiol. 2. Pepcid daily. 3. Tylox. 4. Fish oil. 5. Multivitamins.  DRUG ALLERGIES:  ZOLOFT.  FAMILY HISTORY:  Mother living and well.  Father living, unknown medical history with all remote members in the family has had an unspecified cancer, hypertension, gallbladder disease.  SOCIAL HISTORY:  She is married.  She denies tobacco.  She drinks 1 alcoholic beverage a day.  REVIEW OF SYSTEMS:  Ten-system review of systems is  performed and is noncontributory and negative except as described above.  PHYSICAL EXAMINATION:  GENERAL:  A pleasant, cooperative, Caucasian female, in moderately extreme distress.  She is moving around in bed, cannot find a comfortable position, trying to lie on her side.  Her husband and rest of her family are at the bedside.  Color is good. VITAL SIGNS:  Temperature 98.5, heart rate 106, blood pressure 109/69, respiratory rate 16, oxygen saturation is actually not listed. EYES:  Sclerae clear.  Extraocular movements intact. EARS, NOSE, MOUTH, and THROAT:  Nose, lips, tongue, and oropharynx without gross lesions. NECK:  Supple, nontender.  No mass.  No jugular distention. LUNGS:  Clear to auscultation, anterior exam. HEART:  Regular tachycardia.  No ectopy.  No murmur.  Radial and femoral pulses are palpable. ABDOMEN:  She has a healing transverse scar in the infraumbilical area. No sign of infection or hernia there.  The abdomen is borderline obese. Softer in the upper abdomen, but very tender with involuntary guarding and percussion tenderness in the lower abdomen with peritoneal signs.  I do not feel a mass. PELVIC:  Not performed. EXTREMITIES:  She moves all 4 extremities well without pain or deformity. NEUROLOGIC:  No gross motor or sensory deficits.  ADMISSION DATA:  Hemoglobin 14.2, white  blood cells count 18,500, glucose 103, BUN 50, creatinine 0.74.  Liver function tests normal.  CT scan shows fluid, probable oral contrast and gas in the pelvis, suggesting small bowel injury.  ASSESSMENT: 1. Peritonitis with suspected small bowel perforation.  The etiology     of this is not clear.  It may or may not be related to her recent     laparoscopic surgery. 2. Status post recent laparoscopic right salpingo-oophorectomy. 3. Status post left salpingo-oophorectomy. 4. Status post vaginal hysterectomy.  PLAN:  The patient will be taken to the operating room emergently.  She  will be given fluid resuscitation, intravenous antibiotics.  I have discussed the indications and details of surgery with the patient and her husband.  Risks and possible outcomes have been discussed, including but not limited to bleeding, infection, bowel injury and bowel resection, colostomy, wound healing problems with hernia and infection, injury to adjacent organs, and other unforeseen problems.  She seems to understand these issues well.  At this time, all the questions were answered.  She and her husband are in full agreement to this plan.     Angelia Mould. Derrell Lolling, M.D.     HMI/MEDQ  D:  07/29/2010  T:  07/30/2010  Job:  [id number]  cc:   Duke Salvia. Marcelle Overlie, M.D. Fax: [phone]  Zelphia Cairo, MD Fax: [phone]  Neta Mends. Fabian Sharp, MD [address] [place], [place] [address]  Electronically Signed by Claud Kelp M.D. on 07/30/2010 09:37:34 AM

## 2010-07-31 LAB — CBC
HCT: 31.5 % — ABNORMAL LOW (ref 36.0–46.0)
Hemoglobin: 10.1 g/dL — ABNORMAL LOW (ref 12.0–15.0)
MCH: 32.2 pg (ref 26.0–34.0)
MCHC: 32.1 g/dL (ref 30.0–36.0)
MCV: 100.3 fL — ABNORMAL HIGH (ref 78.0–100.0)
Platelets: 156 10*3/uL (ref 150–400)
RBC: 3.14 MIL/uL — ABNORMAL LOW (ref 3.87–5.11)
RDW: 11.9 % (ref 11.5–15.5)
WBC: 14.4 10*3/uL — ABNORMAL HIGH (ref 4.0–10.5)

## 2010-07-31 LAB — BASIC METABOLIC PANEL
BUN: 7 mg/dL (ref 6–23)
CO2: 27 mEq/L (ref 19–32)
Calcium: 7.8 mg/dL — ABNORMAL LOW (ref 8.4–10.5)
Chloride: 107 mEq/L (ref 96–112)
Creatinine, Ser: 0.81 mg/dL (ref 0.4–1.2)
GFR calc Af Amer: >60 mL/min (ref >60)
GFR calc non Af Amer: >60 mL/min (ref >60)
Glucose, Bld: 117 mg/dL — ABNORMAL HIGH (ref 70–99)
Potassium: 3.7 mEq/L (ref 3.5–5.1)
Sodium: 138 mEq/L (ref 135–145)

## 2010-07-31 LAB — BODY FLUID CULTURE

## 2010-08-01 LAB — BASIC METABOLIC PANEL
BUN: 6 mg/dL (ref 6–23)
CO2: 28 mEq/L (ref 19–32)
Calcium: 8 mg/dL — ABNORMAL LOW (ref 8.4–10.5)
Chloride: 108 mEq/L (ref 96–112)
Creatinine, Ser: 0.79 mg/dL (ref 0.4–1.2)
GFR calc Af Amer: >60 mL/min (ref >60)
GFR calc non Af Amer: >60 mL/min (ref >60)
Glucose, Bld: 88 mg/dL (ref 70–99)
Potassium: 3.8 mEq/L (ref 3.5–5.1)
Sodium: 140 mEq/L (ref 135–145)

## 2010-08-01 LAB — CBC
HCT: 30.3 % — ABNORMAL LOW (ref 36.0–46.0)
Hemoglobin: 9.6 g/dL — ABNORMAL LOW (ref 12.0–15.0)
MCH: 32 pg (ref 26.0–34.0)
MCHC: 31.7 g/dL (ref 30.0–36.0)
MCV: 101 fL — ABNORMAL HIGH (ref 78.0–100.0)
Platelets: 141 10*3/uL — ABNORMAL LOW (ref 150–400)
RBC: 3 MIL/uL — ABNORMAL LOW (ref 3.87–5.11)
RDW: 11.6 % (ref 11.5–15.5)
WBC: 7.8 10*3/uL (ref 4.0–10.5)

## 2010-08-03 LAB — ANAEROBIC CULTURE

## 2010-08-04 LAB — BASIC METABOLIC PANEL
BUN: 3 mg/dL — ABNORMAL LOW (ref 6–23)
CO2: 27 mEq/L (ref 19–32)
Calcium: 8.5 mg/dL (ref 8.4–10.5)
Chloride: 105 mEq/L (ref 96–112)
Creatinine, Ser: 0.72 mg/dL (ref 0.4–1.2)
GFR calc Af Amer: >60 mL/min (ref >60)
GFR calc non Af Amer: >60 mL/min (ref >60)
Glucose, Bld: 112 mg/dL — ABNORMAL HIGH (ref 70–99)
Potassium: 3.7 mEq/L (ref 3.5–5.1)
Sodium: 139 mEq/L (ref 135–145)

## 2010-08-04 LAB — CBC
HCT: 32.9 % — ABNORMAL LOW (ref 36.0–46.0)
Hemoglobin: 10.6 g/dL — ABNORMAL LOW (ref 12.0–15.0)
MCH: 31 pg (ref 26.0–34.0)
MCHC: 32.2 g/dL (ref 30.0–36.0)
MCV: 96.2 fL (ref 78.0–100.0)
Platelets: 211 10*3/uL (ref 150–400)
RBC: 3.42 MIL/uL — ABNORMAL LOW (ref 3.87–5.11)
RDW: 11 % — ABNORMAL LOW (ref 11.5–15.5)
WBC: 8 10*3/uL (ref 4.0–10.5)

## 2010-08-04 LAB — MAGNESIUM: Magnesium: 2 mg/dL (ref 1.5–2.5)

## 2010-08-06 LAB — CBC
HCT: 36.1 % (ref 36.0–46.0)
Hemoglobin: 11.8 g/dL — ABNORMAL LOW (ref 12.0–15.0)
MCH: 31.3 pg (ref 26.0–34.0)
MCHC: 32.7 g/dL (ref 30.0–36.0)
MCV: 95.8 fL (ref 78.0–100.0)
Platelets: 274 10*3/uL (ref 150–400)
RBC: 3.77 MIL/uL — ABNORMAL LOW (ref 3.87–5.11)
RDW: 11 % — ABNORMAL LOW (ref 11.5–15.5)
WBC: 7.4 10*3/uL (ref 4.0–10.5)

## 2010-08-06 LAB — BASIC METABOLIC PANEL
BUN: 9 mg/dL (ref 6–23)
CO2: 27 mEq/L (ref 19–32)
Calcium: 8.8 mg/dL (ref 8.4–10.5)
Chloride: 104 mEq/L (ref 96–112)
Creatinine, Ser: 0.88 mg/dL (ref 0.4–1.2)
GFR calc Af Amer: >60 mL/min (ref >60)
GFR calc non Af Amer: >60 mL/min (ref >60)
Glucose, Bld: 93 mg/dL (ref 70–99)
Potassium: 3.9 mEq/L (ref 3.5–5.1)
Sodium: 140 mEq/L (ref 135–145)

## 2010-08-09 LAB — WOUND CULTURE

## 2010-08-10 HISTORY — PX: OTHER SURGICAL HISTORY: SHX169

## 2010-08-31 NOTE — Discharge Summary (Signed)
[name] [name], [name] [name]               ACCOUNT NO.:  [id number]  MEDICAL RECORD NO.:  [id number]           PATIENT TYPE:  I  LOCATION:  1536                         FACILITY:  [facility]  PHYSICIAN:  Velora Heckler, MD      DATE OF BIRTH:  [birth year]  DATE OF ADMISSION:  07/29/2010 DATE OF DISCHARGE:  08/09/2010                        DISCHARGE SUMMARY - REFERRING   ADMISSION DIAGNOSES: 1. Acute peritonitis with perforated small bowel fecal peritonitis. 2. Inflammation of the appendix with multiple appendicoliths.  DISCHARGE DIAGNOSES: 1. Acute peritonitis with perforated small bowel fecal peritonitis. 2. Inflammation of the appendix with multiple appendicoliths. 3. Postop ileus before. 4. Postop wound drainage. 5. Gastroesophageal reflux disease. 6. History of nephrolithiasis with lithotripsy. 7. Status post vaginal hysterectomy; left salpingo-oophorectomy in     2006; right salpingo-oophorectomy on July 18, 2010. 8. L4-L6 bulging disk disease.  PROCEDURES: 1. CT of the abdomen and pelvis, July 29, 2010, shows a large     collection in the pelvis, containing hyperdense material, likely     secondary to perforated small bowel.  There is free intraperitoneal     gas anterior to the liver, not consistent with recent laparotomy.     Liver gallbladder, spleen, adrenals, pancreas were all within     normal limits. 2. Diagnostic laparoscopy, laparotomy with closure of ileal     perforation and indicated appendectomy, July 29, 2010, Dr. Derrell Lolling.  BRIEF HISTORY:  The patient is a 45 year old white female who recently underwent uneventful laparoscopic right salpingo-oophorectomy by Dr. Marcelle Overlie on July 18, 2010.  She did well, was resuming normal activities and bowel function.  She went to bed feeling well last night and awoke following morning with severe lower abdominal pain which continued to be severe and persistent.  She had chills and vomited.  She was seen at [facility],  maternity admission was evaluated by Dr. Renaldo Fiddler. After evaluation, suspicious for a small bowel perforation, Dr. Derrell Lolling was contacted.  The patient was transferred to [facility] for further evaluation and treatment.  For further history and physical, please see the dictated note.  PAST MEDICAL HISTORY: 1. Vaginal hysterectomy. 2. Left salpingo-oophorectomy. 3. Right salpingo-oophorectomy, July 18, 2010. 4. Lithotripsy for kidney stones. 5. Gastroesophageal reflux disease, status post upper endoscopy with     dilatation. 6. L4-L5 bulging disk.  MEDICATIONS ON ADMISSION:  Estradiol, Pepcid, Tylox, fish oil, and multivitamin.  DRUG ALLERGIES:  ZOLOFT.  HOSPITAL COURSE:  The patient was admitted, Dr. Derrell Lolling had reviewed the study and agreed with diagnosis.  The patient was taken to the operating room at that time.  She underwent procedure, as described above.  She tolerated it well and returned to the recovery room and then intensive care unit in satisfactory condition.  She was stable overnight.  On the first postoperative morning, she was seen by Dr. Freida Busman and looked quite good.  Second postoperative day, her only complaint was itching and pain with movement.  She was stable hemodynamically.  She was on a PCA pump. Her white count was still elevated at 14,000.  She had been placed on Invanz prior to surgery and  this was continued.  She was transferred to the floor on the third postoperative day, her white count was down to 7.8.  Her NG was clamped, but she still had some ongoing nausea and distention and it was left in place for the next 48 hours because of ongoing ileus.  She had some bowel sounds, but continued to have no flatus or bowel movement.  The wound showed good improvement and Telfa strips placed along the incision line, remained dry with no significant drainage.  She continue to make slow progress and was finally started on clear liquids over the weekend.  She was  advanced to clear liquids and then was advanced.  She was weaned off her PCA pump.  She reports long term problems with most pain medications and has always required Phenergan with it.  Since she was on Dilaudid PCA she was switched over to p.o. Dilaudid and Phenergan.  She was maintained on nonsteroidal. She has had 1 bowel movement since her surgery.  She is ambulating.  She is taking a regular diet, but still slow to have a bowel movement.  She had some drainage from the base of her incision on March 26th and March 27th at which time the staples removed, it was opened, was examined by Dr. Gerrit Friends and she was started on wet-to-dry dressings.  It was felt that the fascia was still intact.  By August 08, 2010, she was eating. She still had only had 1 bowel movement.  She had been treated with Dulcolax and actually started on MiraLax today.  She is still having fair amount of discomfort, so we are going to keep her at least another day.  If she has a bowel movement and feels more comfortable, we will plan to send her home tomorrow.  Home health has been arranged for her and they will pack the wound wet to dry b.i.d.  We will have her followup with Dr. Derrell Lolling in the office to have her remaining staples removed next week.  DISCHARGE MEDICATIONS:  She will go home on her, 1. Conjugated linoleic acid 3 tablets daily. 2. DHEA daily. 3. Estradiol tablets p.r.n. daily. 4. Fish oil over the counter daily. 5. Multivitamin daily. 6. We are going to give her Tylenol 1-2 tablets q.6 h. and/or naproxen     500 mg p.o. b.i.d. for pain. 7. Continue her on Flora-Q intestinal flora cap 1 daily. 8. Dilaudid 2 mg tablets, half to one tablet q.4 h. p.r.n. for pain. 9. I will also give her Phenergan 25 mg, 12.5, half tablet p.o. daily     every 4 hours as needed. 10.MiraLax 17 g p.o. daily at bedtime if no bowel movement during the     day.  CONDITION ON DISCHARGE:  Improved.     Eber Hong,  P.A.   ______________________________ Velora Heckler, MD    WDJ/MEDQ  D:  08/08/2010  T:  08/08/2010  Job:  [id number]  cc:   Dr. Theophilus Kinds K. Fabian Sharp, MD [address] [place], [place] [address]  Dr. Zelphia Cairo  Electronically Signed by Sherrie George P.A. on 08/11/2010 11:24:36 AM Electronically Signed by Darnell Level MD on 08/31/2010 06:49:15 AM

## 2010-09-28 NOTE — Op Note (Signed)
NAMEANNICE, JOLLY               ACCOUNT NO.:  [id number]   MEDICAL RECORD NO.:  [id number]          PATIENT TYPE:  OUT   LOCATION:  XRAY                         FACILITY:  [facility]   PHYSICIAN:  Sigmund I. Patsi Sears, M.D.DATE OF BIRTH:  [birth year]   DATE OF PROCEDURE:  07/26/2004  DATE OF DISCHARGE:                                 OPERATIVE REPORT   PREOPERATIVE DIAGNOSES:  Acute left flank pain with known left lower pole  stone.   POSTOPERATIVE DIAGNOSES:  Acute left flank pain with known left lower pole  stone.   OPERATIONS:  Cystourethroscopy, left retrograde pyelogram with  interpretation, left double-J catheter.   SURGEON:  Sigmund I. Patsi Sears, M.D.   ANESTHESIA:  Standby.   PREPARATION:  Appropriate preanesthesia, the patient was brought to the  operating room, placed on the operating room dorsal supine position where  anesthesia standby was used. Cystoscopy was accomplished and showed a normal-  appearing bladder. Left retrograde pyelogram showed what appeared to be a  dilated ureter. No stone was identified in the ureter, and a guidewire was  passed in the renal pelvis and a 6 French x 26-cm catheter was passed into  the kidney, and coiled in the bladder. The patient had previously been given  a B&O suppository. She was given an injection of Toradol, awakened and taken  to the recovery room in good condition.      SIT/MEDQ  D:  07/26/2004  T:  07/26/2004  Job:  [id number]

## 2010-09-28 NOTE — H&P (Signed)
[name] [name], HEROUX               ACCOUNT NO.:  [id number]   MEDICAL RECORD NO.:  [id number]          PATIENT TYPE:  AMB   LOCATION:  SDC                           FACILITY:  [facility]   PHYSICIAN:  Duke Salvia. Marcelle Overlie, M.D.DATE OF BIRTH:  March 18, 1966   DATE OF ADMISSION:  04/25/2005  DATE OF DISCHARGE:                                HISTORY & PHYSICAL   CHIEF COMPLAINT:  Chronic pelvic pain, primarily left lower quadrant pain.   HISTORY OF PRESENT ILLNESS:  A 45 year old, G2, P2 who underwent laparoscopy  with TBH in 2000.  Findings at the time showed the ovaries were normal.  In  the recent past she has had some chronic lower back and left lower quadrant  pain and has had several epidural steroid injections for a presumed nerve  root etiology without significant improvement.  On recent exam on palpation  of the left ovary this reproduced her pain and she presents now for  diagnostic laparoscopy with LSO.  She understands that this may require  laparotomy with USO or BSO depending on the findings.  This procedure  including risk of bleeding, infection, transfusion, adjacent organ injury,  along with her expected recovery time with the possible need for ERT all  reviewed with her which she understands and accepts.   PAST MEDICAL HISTORY:  1.  She has had lithotripsy in the past for kidney stones.  2.  She has had four epidural pain injections.  3.  Prior hysterectomy in 2000.   CURRENT MEDICATIONS:  Hydrocodone for pain.   ALLERGIES:  None.   OTHER SURGERIES:  Laparoscopy in 1991, was told she had endometriosis at  that time.   OBSTETRIC HISTORY:  She is a G2 P2.   PHYSICAL EXAMINATION:  VITAL SIGNS:  Temp 98.2, blood pressure 90/70.  HEENT:  Unremarkable.  NECK:  Supple without masses.  LUNGS:  Clear.  CARDIOVASCULAR:  Regular rate and rhythm without murmurs, rubs, or gallops  noted.  BREASTS:  Without masses.  ABDOMEN:  Soft, flat, nontender.  PELVIC:  Normal external  genitalia.  Vaginal cuff clear.  Bimanual reveals  some tenderness on palpation of the left ovary but no enlargement.  EXTREMITIES:  Unremarkable.   IMPRESSION:  Chronic pelvic pain, probable adnexal adhesions or recurrent  endometriosis.   PLAN:  Diagnostic laparoscopy with a USO, possible BSO.  Procedure and risks  reviewed as above.      Richard M. Marcelle Overlie, M.D.  Electronically Signed     RMH/MEDQ  D:  04/24/2005  T:  04/24/2005  Job:  [id number]

## 2010-09-28 NOTE — Discharge Summary (Signed)
NAMEASRA, GAMBREL               ACCOUNT NO.:  [id number]   MEDICAL RECORD NO.:  [id number]          PATIENT TYPE:  OBV   LOCATION:  9318                          FACILITY:  [facility]   PHYSICIAN:  Duke Salvia. Marcelle Overlie, M.D.DATE OF BIRTH:  Aug 07, 1965   DATE OF ADMISSION:  04/25/2005  DATE OF DISCHARGE:  04/26/2005                                 DISCHARGE SUMMARY   DISCHARGE DIAGNOSES:  1.  Chronic left lower quadrant pain.  2.  Diagnostic laparoscopy with laparoscopic left salpingostomy and lysis of      adhesions this admission.   SUMMARY OF HISTORY AND PHYSICAL EXAMINATION:  Please see the admission H&P  for details.  Briefly, a 45 year old G2, P2, who has had a prior TBH and is  admitted for laparoscopy for evaluation and treatment of chronic left lower  quadrant pain reproduced on the pelvic exam with palpation of the left  ovary.   HOSPITAL COURSE:  On April 25, 2005 under general anesthesia, the patient  underwent laparoscopy with lysis of adhesions, laparoscopic LSO.  She was  observed overnight.  The following a.m., her left lower quadrant pain she  had been complaining about was completely gone.  The incisions looked great.  She was afebrile, tolerating a regular diet.  Preop hemoglobin was 13.8,  postop hemoglobin was 11.7 with a WBC of 8.2.   DISPOSITION:  The patient was discharged on Percocet 5 one or two p.o. q.4-6  h. p.r.n. pain.  Will return to the office in one week.  Advised to report  increased pain, bleeding, and persistent nausea or vomiting, urinary  complaints, or fever over 101.  She was given specific instruction regarding  diet, sex, exercise.   CONDITION ON DISCHARGE:  Good   ACTIVITY:  Graded increase.      Richard M. Marcelle Overlie, M.D.  Electronically Signed     RMH/MEDQ  D:  04/26/2005  T:  04/27/2005  Job:  [id number]

## 2010-09-28 NOTE — Op Note (Signed)
[name] [name], [name] [name]               ACCOUNT NO.:  [id number]   MEDICAL RECORD NO.:  [id number]          PATIENT TYPE:  AMB   LOCATION:  SDC                           FACILITY:  [facility]   PHYSICIAN:  Duke Salvia. Marcelle Overlie, M.D.DATE OF BIRTH:  [birth year]   DATE OF PROCEDURE:  04/25/2005  DATE OF DISCHARGE:                                 OPERATIVE REPORT   PREOPERATIVE DIAGNOSIS:  Chronic left lower quadrant pain.   POSTOPERATIVE DIAGNOSIS:  Dense left adnexal adhesions.   PROCEDURE:  Diagnostic laparoscopy with laparoscopic left salpingo-  oophorectomy and lysis of adhesions.   ANESTHESIA:  General endotracheal.   COMPLICATIONS:  None.   DRAINS:  In-and-out Foley catheter.   SPECIMENS REMOVED:  Left tube and ovary.   PROCEDURE AND FINDINGS:  The patient was taken to the operating room.  After  an adequate level of general endotracheal anesthesia was obtained and with  the patient's legs in stirrups, the abdomen, perineum, and vagina were  prepped and draped in the usual sterile manner for laparoscopy.  The bladder  was drained with an indwelling Foley catheter.  The subumbilical area was  infiltrated with 0.5% Marcaine plain.  A small incision was made.  The  Veress needle was introduced without difficulty.  Its intra-abdominal  position was verified by pressure and water testing.  After 2.5 L  pneumoperitoneum was created, the laparoscopic trocar and sheath were then  introduced without difficulty.  There is no evidence of any bleeding or  trauma.  Two fingerbreadths above the symphysis in the midline, a 5 mm  trocar was inserted under direct visualization.  The patient was then placed  in Trendelenburg and the pelvic findings were as follows.   There was a solitary omental adhesion to the area of the right tube and  ovary, which was lysed in an avascular plane.  The right tube and ovary were  otherwise completely normal.  The left tube and ovary were densely adherent  to the area  of the vaginal cuff.  Once this was noted, an additional lower 5  mm trocar was inserted, and with the tube and ovary placed on traction,  dissection was carried out in an avascular plane, separating the ovary and  tube from the area of the vaginal cuff with very minimal oozing.  Once this  was completed, the course of the left pelvic ureter was traced and noted to  be well below.  The left IP ligament was then coagulated and cut with the  Gyrus PK instrument.  This was done down to, and including, the round  ligament.  The remaining pedicle was coagulated and divided, freeing the  left tube and ovary.  This was cut into four pieces and removed through the  scope without difficulty.  After this was accomplished, the pelvis was  irrigated copiously with saline and several small bleeders were coagulated  superficially with the bipolar.  The bladder was then insufflated with  methylene blue.  There was no  evidence of any bladder injury with the vaginal cuff area hemostatic at that  point.  Instruments were  removed and gas allowed to escape.  The incision  closed with 4-0 Dexon subucit8lar sutures and Dermabond.  She tolerated this  well and went to the recovery room in good condition.      Richard M. Marcelle Overlie, M.D.  Electronically Signed     RMH/MEDQ  D:  04/25/2005  T:  04/25/2005  Job:  [id number]

## 2011-03-06 ENCOUNTER — Other Ambulatory Visit (INDEPENDENT_AMBULATORY_CARE_PROVIDER_SITE_OTHER): Payer: BC Managed Care – PPO

## 2011-03-06 DIAGNOSIS — Z0000 Encounter for general adult medical examination without abnormal findings: Secondary | ICD-10-CM

## 2011-03-06 LAB — HEPATIC FUNCTION PANEL
ALT: 23 U/L (ref 0–35)
AST: 20 U/L (ref 0–37)
Albumin: 4.2 g/dL (ref 3.5–5.2)
Alkaline Phosphatase: 38 U/L — ABNORMAL LOW (ref 39–117)
Bilirubin, Direct: 0 mg/dL (ref 0.0–0.3)
Total Bilirubin: 0.5 mg/dL (ref 0.3–1.2)
Total Protein: 7.3 g/dL (ref 6.0–8.3)

## 2011-03-06 LAB — BASIC METABOLIC PANEL
BUN: 15 mg/dL (ref 6–23)
CO2: 27 mEq/L (ref 19–32)
Calcium: 8.9 mg/dL (ref 8.4–10.5)
Chloride: 107 mEq/L (ref 96–112)
Creatinine, Ser: 0.8 mg/dL (ref 0.4–1.2)
GFR: 79.75 mL/min (ref >60.00)
Glucose, Bld: 90 mg/dL (ref 70–99)
Potassium: 4.9 mEq/L (ref 3.5–5.1)
Sodium: 142 mEq/L (ref 135–145)

## 2011-03-06 LAB — LIPID PANEL
Cholesterol: 173 mg/dL (ref 0–200)
HDL: 57.3 mg/dL (ref >39.00)
LDL Cholesterol: 83 mg/dL (ref 0–99)
Total CHOL/HDL Ratio: 3
Triglycerides: 166 mg/dL — ABNORMAL HIGH (ref 0.0–149.0)
VLDL: 33.2 mg/dL (ref 0.0–40.0)

## 2011-03-06 LAB — CBC WITH DIFFERENTIAL/PLATELET
Basophils Absolute: 0 10*3/uL (ref 0.0–0.1)
Basophils Relative: 0.4 % (ref 0.0–3.0)
Eosinophils Absolute: 0.1 10*3/uL (ref 0.0–0.7)
Eosinophils Relative: 1.9 % (ref 0.0–5.0)
HCT: 40.4 % (ref 36.0–46.0)
Hemoglobin: 13.6 g/dL (ref 12.0–15.0)
Lymphocytes Relative: 27.8 % (ref 12.0–46.0)
Lymphs Abs: 1.8 10*3/uL (ref 0.7–4.0)
MCHC: 33.8 g/dL (ref 30.0–36.0)
MCV: 99.1 fl (ref 78.0–100.0)
Monocytes Absolute: 0.4 10*3/uL (ref 0.1–1.0)
Monocytes Relative: 5.9 % (ref 3.0–12.0)
Neutro Abs: 4.1 10*3/uL (ref 1.4–7.7)
Neutrophils Relative %: 64 % (ref 43.0–77.0)
Platelets: 221 10*3/uL (ref 150.0–400.0)
RBC: 4.08 Mil/uL (ref 3.87–5.11)
RDW: 12.8 % (ref 11.5–14.6)
WBC: 6.5 10*3/uL (ref 4.5–10.5)

## 2011-03-06 LAB — POCT URINALYSIS DIPSTICK
Bilirubin, UA: NEGATIVE
Blood, UA: NEGATIVE
Glucose, UA: NEGATIVE
Ketones, UA: NEGATIVE
Leukocytes, UA: NEGATIVE
Nitrite, UA: NEGATIVE
Protein, UA: NEGATIVE
Spec Grav, UA: 1.015
Urobilinogen, UA: 0.2
pH, UA: 7

## 2011-03-06 LAB — TSH: TSH: 1.68 u[IU]/mL (ref 0.35–5.50)

## 2011-03-20 ENCOUNTER — Ambulatory Visit (INDEPENDENT_AMBULATORY_CARE_PROVIDER_SITE_OTHER): Payer: BC Managed Care – PPO | Admitting: Internal Medicine

## 2011-03-20 ENCOUNTER — Encounter: Payer: Self-pay | Admitting: Internal Medicine

## 2011-03-20 VITALS — BP 122/84 | HR 78 | Ht 65.5 in | Wt 176.0 lb

## 2011-03-20 DIAGNOSIS — H9319 Tinnitus, unspecified ear: Secondary | ICD-10-CM

## 2011-03-20 DIAGNOSIS — E8940 Asymptomatic postprocedural ovarian failure: Secondary | ICD-10-CM

## 2011-03-20 DIAGNOSIS — R030 Elevated blood-pressure reading, without diagnosis of hypertension: Secondary | ICD-10-CM

## 2011-03-20 DIAGNOSIS — Z0000 Encounter for general adult medical examination without abnormal findings: Secondary | ICD-10-CM

## 2011-03-20 DIAGNOSIS — Z79890 Hormone replacement therapy (postmenopausal): Secondary | ICD-10-CM

## 2011-03-20 NOTE — Patient Instructions (Signed)
Check BP readings 3 x per week at least Goal is below 140/90  majority of the time. Continue lifestyle intervention healthy eating and exercise .   And weight loss.   Get labs done yearly

## 2011-03-20 NOTE — Progress Notes (Signed)
Subjective:    Patient ID: [name] [name], female    DOB: [birth year], 45 y.o.   MRN: [id number]   HPI Patient comes in today for preventive visit and follow-up of medical issues. Update of her history since her last visit. Had ovary remove last spring and had nicked bowel complication and second surgery.  Since then has been losing weight with diet change and exercise .  She had gained a good bit of weight.  BP readings have been ocassionally  up for a while  Mom with HT.   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 GI /GU: No adominal pain, vomiting, change in bowel habits. No blood in the stool. No significant GU symptoms. SKIN/HEME: ,no acute skin rashes suspicious lesions or bleeding. No lymphadenopathy, nodules, masses.  NEURO/ PSYCH:  No neurologic signs such as weakness numbness No depression anxiety. IMM/ Allergy: No unusual infections.  Allergy .   REST of 12 system review negative   Past Medical History  Diagnosis Date  . Acne     accutane in 2000  . Back pain   . Neck pain   . Endometriosis   . Bowel perforation     at pelvic surgery   3 2012     Past Surgical History  Procedure Date  . Abdominal hysterectomy   . Left ovary removed   . Lithotripsy   . Exploratory laparotomy   . Right ovary removed 08/10/10    complication bowel perforation    reports that she has quit smoking. She does not have any smokeless tobacco history on file. She reports that she drinks alcohol. Her drug history not on file. family history includes Coronary artery disease in her maternal grandmother; Healthy in her father and mother; Other in an unspecified  family member; and Vasculitis in her sister. Allergies  Allergen Reactions  . Sertraline Hcl        Objective:   Physical Exam  Physical Exam: Vital signs reviewed JXB:JYNW is a well-developed well-nourished alert cooperative  white female who appears her stated age in no acute distress.  HEENT: normocephalic  traumatic , Eyes: PERRL EOM's full, conjunctiva clear, Nares: paten,t no deformity discharge or tenderness., Ears: no deformity EAC's clear TMs with normal landmarks. Mouth: clear OP, no lesions, edema.  Moist mucous membranes. Dentition in adequate repair. NECK: supple without masses, thyromegaly or bruits. CHEST/PULM:  Clear to auscultation and percussion breath sounds equal no wheeze , rales or rhonchi. No chest wall deformities or tenderness. Breast: normal by inspection . No dimpling, discharge, masses, tenderness or discharge .  CV: PMI is nondisplaced, S1 S2 no gallops, murmurs, rubs. Peripheral pulses are full without delay.No JVD .  ABDOMEN: Bowel sounds normal nontender  No guard or rebound, no hepato splenomegal no CVA tenderness.  No hernia.  Well healed abdominal scar . Extremtities:  No clubbing cyanosis or edema, no acute joint swelling or redness no focal atrophy NEURO:  Oriented x3, cranial nerves 3-12 appear to be intact, no obvious focal weakness,gait within normal limits no abnormal reflexes or asymmetrical SKIN: No acute rashes normal turgor, color, no bruising or petechiae. PSYCH: Oriented, good eye contact,  no obvious depression anxiety, cognition and judgment appear normal. Oriented x 3 and no noted deficits in memory, attention, and speech.   Lab Results  Component Value Date   WBC 6.5 03/06/2011   HGB 13.6 03/06/2011   HCT 40.4 03/06/2011   PLT 221.0 03/06/2011   GLUCOSE 90 03/06/2011   CHOL 173 03/06/2011   TRIG 166.0* 03/06/2011   HDL 57.30 03/06/2011   LDLCALC 83 03/06/2011   ALT 23 03/06/2011   AST 20 03/06/2011   NA 142 03/06/2011   K 4.9  03/06/2011   CL 107 03/06/2011   CREATININE 0.8 03/06/2011   BUN 15 03/06/2011   CO2 27 03/06/2011   TSH 1.68 03/06/2011         Assessment & Plan:  Preventive Health Care Counseled regarding healthy nutrition, exercise, sleep, injury prevention, calcium vit d and healthy weight . HRT Anxiety  Lifestyle intervention PPI use. S/p bowel peroration. Tinnitus  Disc ear protection Elevated BP READINGS  Seems ok on repeat  May need med in future but ok to Continue lifestyle intervention healthy eating and exercise .  and monitor   . To come back if elevated and may need to add meds.

## 2011-03-21 DIAGNOSIS — Z0000 Encounter for general adult medical examination without abnormal findings: Secondary | ICD-10-CM | POA: Insufficient documentation

## 2011-03-21 DIAGNOSIS — H9319 Tinnitus, unspecified ear: Secondary | ICD-10-CM | POA: Insufficient documentation

## 2011-03-21 DIAGNOSIS — Z79890 Hormone replacement therapy (postmenopausal): Secondary | ICD-10-CM | POA: Insufficient documentation

## 2013-05-07 ENCOUNTER — Other Ambulatory Visit: Payer: Self-pay | Admitting: Obstetrics and Gynecology

## 2013-05-07 DIAGNOSIS — R928 Other abnormal and inconclusive findings on diagnostic imaging of breast: Secondary | ICD-10-CM

## 2013-05-17 ENCOUNTER — Ambulatory Visit
Admission: RE | Admit: 2013-05-17 | Discharge: 2013-05-17 | Disposition: A | Discharge status: Home / Self Care | Payer: BC Managed Care – PPO | Source: Ambulatory Visit | Attending: Obstetrics and Gynecology | Admitting: Obstetrics and Gynecology

## 2013-05-17 DIAGNOSIS — R928 Other abnormal and inconclusive findings on diagnostic imaging of breast: Secondary | ICD-10-CM

## 2013-10-29 ENCOUNTER — Other Ambulatory Visit: Payer: Self-pay | Admitting: Obstetrics and Gynecology

## 2013-10-29 DIAGNOSIS — N6489 Other specified disorders of breast: Secondary | ICD-10-CM

## 2013-11-15 ENCOUNTER — Ambulatory Visit
Admission: RE | Admit: 2013-11-15 | Discharge: 2013-11-15 | Disposition: A | Discharge status: Home / Self Care | Payer: BC Managed Care – PPO | Source: Ambulatory Visit | Attending: Obstetrics and Gynecology | Admitting: Obstetrics and Gynecology

## 2013-11-15 ENCOUNTER — Encounter (INDEPENDENT_AMBULATORY_CARE_PROVIDER_SITE_OTHER): Payer: Self-pay

## 2013-11-15 DIAGNOSIS — N6489 Other specified disorders of breast: Secondary | ICD-10-CM

## 2014-04-18 ENCOUNTER — Other Ambulatory Visit: Payer: Self-pay | Admitting: Obstetrics and Gynecology

## 2014-04-18 DIAGNOSIS — N6489 Other specified disorders of breast: Secondary | ICD-10-CM

## 2014-05-03 ENCOUNTER — Other Ambulatory Visit: Payer: Self-pay | Admitting: Obstetrics and Gynecology

## 2014-05-04 LAB — CYTOLOGY - PAP

## 2014-05-30 ENCOUNTER — Ambulatory Visit
Admission: RE | Admit: 2014-05-30 | Discharge: 2014-05-30 | Disposition: A | Discharge status: Home / Self Care | Payer: BLUE CROSS/BLUE SHIELD | Source: Ambulatory Visit | Attending: Obstetrics and Gynecology | Admitting: Obstetrics and Gynecology

## 2014-05-30 ENCOUNTER — Other Ambulatory Visit: Payer: Self-pay | Admitting: Obstetrics and Gynecology

## 2014-05-30 DIAGNOSIS — N6489 Other specified disorders of breast: Secondary | ICD-10-CM

## 2014-06-02 ENCOUNTER — Other Ambulatory Visit: Payer: Self-pay | Admitting: Obstetrics and Gynecology

## 2014-06-02 DIAGNOSIS — N6489 Other specified disorders of breast: Secondary | ICD-10-CM

## 2014-06-09 ENCOUNTER — Ambulatory Visit
Admission: RE | Admit: 2014-06-09 | Discharge: 2014-06-09 | Disposition: A | Discharge status: Home / Self Care | Payer: BLUE CROSS/BLUE SHIELD | Source: Ambulatory Visit | Attending: Obstetrics and Gynecology | Admitting: Obstetrics and Gynecology

## 2014-06-09 DIAGNOSIS — N6489 Other specified disorders of breast: Secondary | ICD-10-CM

## 2014-11-07 ENCOUNTER — Ambulatory Visit (INDEPENDENT_AMBULATORY_CARE_PROVIDER_SITE_OTHER): Payer: BLUE CROSS/BLUE SHIELD | Admitting: Internal Medicine

## 2014-11-07 ENCOUNTER — Other Ambulatory Visit: Payer: Self-pay | Admitting: Family Medicine

## 2014-11-07 ENCOUNTER — Encounter: Payer: Self-pay | Admitting: Internal Medicine

## 2014-11-07 ENCOUNTER — Ambulatory Visit (INDEPENDENT_AMBULATORY_CARE_PROVIDER_SITE_OTHER)
Admission: RE | Admit: 2014-11-07 | Discharge: 2014-11-07 | Disposition: A | Discharge status: Home / Self Care | Payer: BLUE CROSS/BLUE SHIELD | Source: Ambulatory Visit | Attending: Internal Medicine | Admitting: Internal Medicine

## 2014-11-07 VITALS — BP 134/100 | Temp 98.4°F | Ht 64.75 in | Wt 173.5 lb

## 2014-11-07 DIAGNOSIS — J189 Pneumonia, unspecified organism: Secondary | ICD-10-CM

## 2014-11-07 DIAGNOSIS — R05 Cough: Secondary | ICD-10-CM

## 2014-11-07 DIAGNOSIS — R030 Elevated blood-pressure reading, without diagnosis of hypertension: Secondary | ICD-10-CM

## 2014-11-07 DIAGNOSIS — J019 Acute sinusitis, unspecified: Secondary | ICD-10-CM | POA: Diagnosis not present

## 2014-11-07 DIAGNOSIS — IMO0001 Reserved for inherently not codable concepts without codable children: Secondary | ICD-10-CM

## 2014-11-07 DIAGNOSIS — R053 Chronic cough: Secondary | ICD-10-CM

## 2014-11-07 DIAGNOSIS — J181 Lobar pneumonia, unspecified organism: Principal | ICD-10-CM

## 2014-11-07 HISTORY — DX: Pneumonia, unspecified organism: J18.9

## 2014-11-07 MED ORDER — HYDROCODONE-HOMATROPINE 5-1.5 MG/5ML PO SYRP: ORAL_SOLUTION | ORAL | Status: DC

## 2014-11-07 MED ORDER — DOXYCYCLINE HYCLATE 100 MG PO TABS: 100.0000 mg | ORAL_TABLET | Freq: Every day | ORAL | Status: DC

## 2014-11-07 NOTE — Patient Instructions (Addendum)
Treatment for sinusitis .     But need x ray to check lungs.   To be sure ok.  Cough med for comfort .  Your blood pressure is up.  Monitor and come back for  Wellness and poss blood pressure medication.
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
## 2014-11-15 ENCOUNTER — Encounter: Payer: Self-pay | Admitting: Internal Medicine

## 2014-11-15 ENCOUNTER — Ambulatory Visit (INDEPENDENT_AMBULATORY_CARE_PROVIDER_SITE_OTHER): Payer: BLUE CROSS/BLUE SHIELD | Admitting: Internal Medicine

## 2014-11-15 VITALS — BP 120/82 | HR 77 | Temp 97.3°F | Wt 173.0 lb

## 2014-11-15 DIAGNOSIS — J189 Pneumonia, unspecified organism: Secondary | ICD-10-CM | POA: Diagnosis not present

## 2014-11-15 DIAGNOSIS — J181 Lobar pneumonia, unspecified organism: Principal | ICD-10-CM

## 2014-11-15 MED ORDER — CEFTRIAXONE SODIUM 1 G IJ SOLR
1.0000 g | Freq: Once | INTRAMUSCULAR | Status: AC
  Administered 2014-11-15: 1 g via INTRAMUSCULAR

## 2014-11-15 MED ORDER — LEVOFLOXACIN 750 MG PO TABS
750.0000 mg | ORAL_TABLET | Freq: Every day | ORAL | Status: DC
Start: 2014-11-15 — End: 2014-12-02
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
## 2014-11-15 NOTE — Patient Instructions (Signed)
Change antibiotic to broader spectrum.    Should note improvement   Before the end of the week.    Plan ROV  in not happening  Before the weekend   If doing better let us know and we should plan FU  .  In about 2 weeks .

## 2014-12-02 ENCOUNTER — Ambulatory Visit (INDEPENDENT_AMBULATORY_CARE_PROVIDER_SITE_OTHER): Payer: BLUE CROSS/BLUE SHIELD | Admitting: Internal Medicine

## 2014-12-02 ENCOUNTER — Encounter: Payer: Self-pay | Admitting: Internal Medicine

## 2014-12-02 VITALS — BP 156/100 | HR 76 | Temp 98.5°F | Wt 172.5 lb

## 2014-12-02 DIAGNOSIS — R030 Elevated blood-pressure reading, without diagnosis of hypertension: Secondary | ICD-10-CM | POA: Diagnosis not present

## 2014-12-02 DIAGNOSIS — J181 Lobar pneumonia, unspecified organism: Principal | ICD-10-CM

## 2014-12-02 DIAGNOSIS — I10 Essential (primary) hypertension: Secondary | ICD-10-CM | POA: Diagnosis not present

## 2014-12-02 DIAGNOSIS — J189 Pneumonia, unspecified organism: Secondary | ICD-10-CM | POA: Diagnosis not present

## 2014-12-02 DIAGNOSIS — M542 Cervicalgia: Secondary | ICD-10-CM | POA: Diagnosis not present

## 2014-12-02 DIAGNOSIS — IMO0001 Reserved for inherently not codable concepts without codable children: Secondary | ICD-10-CM

## 2014-12-02 MED ORDER — VALSARTAN 80 MG PO TABS: 80.0000 mg | ORAL_TABLET | Freq: Every day | ORAL | Status: DC
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
## 2014-12-02 NOTE — Patient Instructions (Signed)
Get x ray in 1-2 weeks repeat.  Your bp is still too high and may need to be treated .    Ok to  Go back on  flonase .  For now.  Take blood pressure readings twice a day for 10 - 14   days and then periodically .To ensure below 140/90   .Send in readings    My chart .   Begin  Medication unless  At goal.    Plan   Wellness visit in 2 months  With labs and bring in your monitor to  Visit to check.

## 2014-12-09 ENCOUNTER — Encounter: Payer: Self-pay | Admitting: Internal Medicine

## 2014-12-13 ENCOUNTER — Ambulatory Visit (INDEPENDENT_AMBULATORY_CARE_PROVIDER_SITE_OTHER)
Admission: RE | Admit: 2014-12-13 | Discharge: 2014-12-13 | Disposition: A | Discharge status: Home / Self Care | Payer: BLUE CROSS/BLUE SHIELD | Source: Ambulatory Visit | Attending: Internal Medicine | Admitting: Internal Medicine

## 2014-12-13 DIAGNOSIS — J189 Pneumonia, unspecified organism: Secondary | ICD-10-CM

## 2014-12-13 DIAGNOSIS — J181 Lobar pneumonia, unspecified organism: Principal | ICD-10-CM

## 2014-12-26 ENCOUNTER — Encounter: Payer: Self-pay | Admitting: Internal Medicine

## 2014-12-26 NOTE — Telephone Encounter (Signed)
We could go either way on this   .  If not taking medication  Can just send in readings in 2-3 months.  current readings are high normal or" pre hypertension" reading. (Would use lower dose of medication  40 mg if we need this )

## 2015-02-20 ENCOUNTER — Other Ambulatory Visit: Payer: BLUE CROSS/BLUE SHIELD | Admitting: Internal Medicine

## 2015-02-20 ENCOUNTER — Other Ambulatory Visit (INDEPENDENT_AMBULATORY_CARE_PROVIDER_SITE_OTHER): Payer: BLUE CROSS/BLUE SHIELD

## 2015-02-20 DIAGNOSIS — Z0000 Encounter for general adult medical examination without abnormal findings: Secondary | ICD-10-CM

## 2015-02-20 LAB — CBC WITH DIFFERENTIAL/PLATELET
Basophils Absolute: 0 10*3/uL (ref 0.0–0.1)
Basophils Relative: 0.3 % (ref 0.0–3.0)
Eosinophils Absolute: 0.2 10*3/uL (ref 0.0–0.7)
Eosinophils Relative: 2.6 % (ref 0.0–5.0)
HCT: 39.7 % (ref 36.0–46.0)
Hemoglobin: 13.3 g/dL (ref 12.0–15.0)
Lymphocytes Relative: 23.9 % (ref 12.0–46.0)
Lymphs Abs: 1.7 10*3/uL (ref 0.7–4.0)
MCHC: 33.4 g/dL (ref 30.0–36.0)
MCV: 96.5 fl (ref 78.0–100.0)
Monocytes Absolute: 0.4 10*3/uL (ref 0.1–1.0)
Monocytes Relative: 5.8 % (ref 3.0–12.0)
Neutro Abs: 4.9 10*3/uL (ref 1.4–7.7)
Neutrophils Relative %: 67.4 % (ref 43.0–77.0)
Platelets: 213 10*3/uL (ref 150.0–400.0)
RBC: 4.12 Mil/uL (ref 3.87–5.11)
RDW: 12.5 % (ref 11.5–15.5)
WBC: 7.2 10*3/uL (ref 4.0–10.5)

## 2015-02-20 LAB — HEPATIC FUNCTION PANEL
ALT: 16 U/L (ref 0–35)
AST: 15 U/L (ref 0–37)
Albumin: 4 g/dL (ref 3.5–5.2)
Alkaline Phosphatase: 42 U/L (ref 39–117)
Bilirubin, Direct: 0.1 mg/dL (ref 0.0–0.3)
Total Bilirubin: 0.4 mg/dL (ref 0.2–1.2)
Total Protein: 6.9 g/dL (ref 6.0–8.3)

## 2015-02-20 LAB — BASIC METABOLIC PANEL
BUN: 19 mg/dL (ref 6–23)
CO2: 28 mEq/L (ref 19–32)
Calcium: 8.7 mg/dL (ref 8.4–10.5)
Chloride: 103 mEq/L (ref 96–112)
Creatinine, Ser: 0.78 mg/dL (ref 0.40–1.20)
GFR: 83.08 mL/min (ref >60.00)
Glucose, Bld: 91 mg/dL (ref 70–99)
Potassium: 4.3 mEq/L (ref 3.5–5.1)
Sodium: 139 mEq/L (ref 135–145)

## 2015-02-20 LAB — LIPID PANEL
Cholesterol: 187 mg/dL (ref 0–200)
HDL: 59.7 mg/dL (ref >39.00)
LDL Cholesterol: 96 mg/dL (ref 0–99)
NonHDL: 127.07
Total CHOL/HDL Ratio: 3
Triglycerides: 153 mg/dL — ABNORMAL HIGH (ref 0.0–149.0)
VLDL: 30.6 mg/dL (ref 0.0–40.0)

## 2015-02-20 LAB — TSH: TSH: 1.55 u[IU]/mL (ref 0.35–4.50)

## 2015-02-22 ENCOUNTER — Ambulatory Visit (INDEPENDENT_AMBULATORY_CARE_PROVIDER_SITE_OTHER): Payer: BLUE CROSS/BLUE SHIELD | Admitting: Internal Medicine

## 2015-02-22 ENCOUNTER — Encounter: Payer: Self-pay | Admitting: Internal Medicine

## 2015-02-22 VITALS — BP 122/92 | Temp 98.0°F | Ht 65.0 in | Wt 175.8 lb

## 2015-02-22 DIAGNOSIS — R000 Tachycardia, unspecified: Secondary | ICD-10-CM

## 2015-02-22 DIAGNOSIS — Z658 Other specified problems related to psychosocial circumstances: Secondary | ICD-10-CM | POA: Diagnosis not present

## 2015-02-22 DIAGNOSIS — R0609 Other forms of dyspnea: Secondary | ICD-10-CM

## 2015-02-22 DIAGNOSIS — Z0000 Encounter for general adult medical examination without abnormal findings: Secondary | ICD-10-CM | POA: Diagnosis not present

## 2015-02-22 DIAGNOSIS — R0600 Dyspnea, unspecified: Secondary | ICD-10-CM

## 2015-02-22 DIAGNOSIS — F439 Reaction to severe stress, unspecified: Secondary | ICD-10-CM

## 2015-02-22 DIAGNOSIS — Z1211 Encounter for screening for malignant neoplasm of colon: Secondary | ICD-10-CM

## 2015-02-22 DIAGNOSIS — I10 Essential (primary) hypertension: Secondary | ICD-10-CM

## 2015-02-22 DIAGNOSIS — Z23 Encounter for immunization: Secondary | ICD-10-CM | POA: Diagnosis not present

## 2015-02-22 NOTE — Patient Instructions (Signed)
Exam is good today . Try 40 mg of the bp medication.  Take pulse with rapid heart rate  consideration of  Seeing cardiology heart monitor stress tests and or echo cardiogram to categorize risk .   Send in readings  Of BP and any heart rate  Information.     Health Maintenance, Female Adopting a healthy lifestyle and getting preventive care can go a long way to promote health and wellness. Talk with your health care provider about what schedule of regular examinations is right for you. This is a good chance for you to check in with your provider about disease prevention and staying healthy. In between checkups, there are plenty of things you can do on your own. Experts have done a lot of research about which lifestyle changes and preventive measures are most likely to keep you healthy. Ask your health care provider for more information. WEIGHT AND DIET  Eat a healthy diet  Be sure to include plenty of vegetables, fruits, low-fat dairy products, and lean protein.  Do not eat a lot of foods high in solid fats, added sugars, or salt.  Get regular exercise. This is one of the most important things you can do for your health.  Most adults should exercise for at least 150 minutes each week. The exercise should increase your heart rate and make you sweat (moderate-intensity exercise).  Most adults should also do strengthening exercises at least twice a week. This is in addition to the moderate-intensity exercise.  Maintain a healthy weight  Body mass index (BMI) is a measurement that can be used to identify possible weight problems. It estimates body fat based on height and weight. Your health care provider can help determine your BMI and help you achieve or maintain a healthy weight.  For females 10 years of age and older:   A BMI below 18.5 is considered underweight.  A BMI of 18.5 to 24.9 is normal.  A BMI of 25 to 29.9 is considered overweight.  A BMI of 30 and above is considered  obese.  Watch levels of cholesterol and blood lipids  You should start having your blood tested for lipids and cholesterol at 50 years of age, then have this test every 5 years.  You may need to have your cholesterol levels checked more often if:  Your lipid or cholesterol levels are high.  You are older than 50 years of age.  You are at high risk for heart disease.  CANCER SCREENING   Lung Cancer  Lung cancer screening is recommended for adults 52-12 years old who are at high risk for lung cancer because of a history of smoking.  A yearly low-dose CT scan of the lungs is recommended for people who:  Currently smoke.  Have quit within the past 15 years.  Have at least a 30-pack-year history of smoking. A pack year is smoking an average of one pack of cigarettes a day for 1 year.  Yearly screening should continue until it has been 15 years since you quit.  Yearly screening should stop if you develop a health problem that would prevent you from having lung cancer treatment.  Breast Cancer  Practice breast self-awareness. This means understanding how your breasts normally appear and feel.  It also means doing regular breast self-exams. Let your health care provider know about any changes, no matter how small.  If you are in your 20s or 30s, you should have a clinical breast exam (CBE) by a health  care provider every 1-3 years as part of a regular health exam.  If you are 34 or older, have a CBE every year. Also consider having a breast X-ray (mammogram) every year.  If you have a family history of breast cancer, talk to your health care provider about genetic screening.  If you are at high risk for breast cancer, talk to your health care provider about having an MRI and a mammogram every year.  Breast cancer gene (BRCA) assessment is recommended for women who have family members with BRCA-related cancers. BRCA-related cancers  include:  Breast.  Ovarian.  Tubal.  Peritoneal cancers.  Results of the assessment will determine the need for genetic counseling and BRCA1 and BRCA2 testing. Cervical Cancer Your health care provider may recommend that you be screened regularly for cancer of the pelvic organs (ovaries, uterus, and vagina). This screening involves a pelvic examination, including checking for microscopic changes to the surface of your cervix (Pap test). You may be encouraged to have this screening done every 3 years, beginning at age 21.  For women ages 89-65, health care providers may recommend pelvic exams and Pap testing every 3 years, or they may recommend the Pap and pelvic exam, combined with testing for human papilloma virus (HPV), every 5 years. Some types of HPV increase your risk of cervical cancer. Testing for HPV may also be done on women of any age with unclear Pap test results.  Other health care providers may not recommend any screening for nonpregnant women who are considered low risk for pelvic cancer and who do not have symptoms. Ask your health care provider if a screening pelvic exam is right for you.  If you have had past treatment for cervical cancer or a condition that could lead to cancer, you need Pap tests and screening for cancer for at least 20 years after your treatment. If Pap tests have been discontinued, your risk factors (such as having a new sexual partner) need to be reassessed to determine if screening should resume. Some women have medical problems that increase the chance of getting cervical cancer. In these cases, your health care provider may recommend more frequent screening and Pap tests. Colorectal Cancer  This type of cancer can be detected and often prevented.  Routine colorectal cancer screening usually begins at 50 years of age and continues through 50 years of age.  Your health care provider may recommend screening at an earlier age if you have risk factors for  colon cancer.  Your health care provider may also recommend using home test kits to check for hidden blood in the stool.  A small camera at the end of a tube can be used to examine your colon directly (sigmoidoscopy or colonoscopy). This is done to check for the earliest forms of colorectal cancer.  Routine screening usually begins at age 36.  Direct examination of the colon should be repeated every 5-10 years through 50 years of age. However, you may need to be screened more often if early forms of precancerous polyps or small growths are found. Skin Cancer  Check your skin from head to toe regularly.  Tell your health care provider about any new moles or changes in moles, especially if there is a change in a mole's shape or color.  Also tell your health care provider if you have a mole that is larger than the size of a pencil eraser.  Always use sunscreen. Apply sunscreen liberally and repeatedly throughout the day.  Protect  yourself by wearing long sleeves, pants, a wide-brimmed hat, and sunglasses whenever you are outside. HEART DISEASE, DIABETES, AND HIGH BLOOD PRESSURE   High blood pressure causes heart disease and increases the risk of stroke. High blood pressure is more likely to develop in:  People who have blood pressure in the high end of the normal range (130-139/85-89 mm Hg).  People who are overweight or obese.  People who are African American.  If you are 54-65 years of age, have your blood pressure checked every 3-5 years. If you are 81 years of age or older, have your blood pressure checked every year. You should have your blood pressure measured twice--once when you are at a hospital or clinic, and once when you are not at a hospital or clinic. Record the average of the two measurements. To check your blood pressure when you are not at a hospital or clinic, you can use:  An automated blood pressure machine at a pharmacy.  A home blood pressure monitor.  If you  are between 81 years and 18 years old, ask your health care provider if you should take aspirin to prevent strokes.  Have regular diabetes screenings. This involves taking a blood sample to check your fasting blood sugar level.  If you are at a normal weight and have a low risk for diabetes, have this test once every three years after 50 years of age.  If you are overweight and have a high risk for diabetes, consider being tested at a younger age or more often. PREVENTING INFECTION  Hepatitis B  If you have a higher risk for hepatitis B, you should be screened for this virus. You are considered at high risk for hepatitis B if:  You were born in a country where hepatitis B is common. Ask your health care provider which countries are considered high risk.  Your parents were born in a high-risk country, and you have not been immunized against hepatitis B (hepatitis B vaccine).  You have HIV or AIDS.  You use needles to inject street drugs.  You live with someone who has hepatitis B.  You have had sex with someone who has hepatitis B.  You get hemodialysis treatment.  You take certain medicines for conditions, including cancer, organ transplantation, and autoimmune conditions. Hepatitis C  Blood testing is recommended for:  Everyone born from [birth year] through 1966.  Anyone with known risk factors for hepatitis C. Sexually transmitted infections (STIs)  You should be screened for sexually transmitted infections (STIs) including gonorrhea and chlamydia if:  You are sexually active and are younger than 51 years of age.  You are older than 50 years of age and your health care provider tells you that you are at risk for this type of infection.  Your sexual activity has changed since you were last screened and you are at an increased risk for chlamydia or gonorrhea. Ask your health care provider if you are at risk.  If you do not have HIV, but are at risk, it may be recommended that you  take a prescription medicine daily to prevent HIV infection. This is called pre-exposure prophylaxis (PrEP). You are considered at risk if:  You are sexually active and do not regularly use condoms or know the HIV status of your partner(s).  You take drugs by injection.  You are sexually active with a partner who has HIV. Talk with your health care provider about whether you are at high risk of being infected with  HIV. If you choose to begin PrEP, you should first be tested for HIV. You should then be tested every 3 months for as long as you are taking PrEP.  PREGNANCY   If you are premenopausal and you may become pregnant, ask your health care provider about preconception counseling.  If you may become pregnant, take 400 to 800 micrograms (mcg) of folic acid every day.  If you want to prevent pregnancy, talk to your health care provider about birth control (contraception). OSTEOPOROSIS AND MENOPAUSE   Osteoporosis is a disease in which the bones lose minerals and strength with aging. This can result in serious bone fractures. Your risk for osteoporosis can be identified using a bone density scan.  If you are 78 years of age or older, or if you are at risk for osteoporosis and fractures, ask your health care provider if you should be screened.  Ask your health care provider whether you should take a calcium or vitamin D supplement to lower your risk for osteoporosis.  Menopause may have certain physical symptoms and risks.  Hormone replacement therapy may reduce some of these symptoms and risks. Talk to your health care provider about whether hormone replacement therapy is right for you.  HOME CARE INSTRUCTIONS   Schedule regular health, dental, and eye exams.  Stay current with your immunizations.   Do not use any tobacco products including cigarettes, chewing tobacco, or electronic cigarettes.  If you are pregnant, do not drink alcohol.  If you are breastfeeding, limit how  much and how often you drink alcohol.  Limit alcohol intake to no more than 1 drink per day for nonpregnant women. One drink equals 12 ounces of beer, 5 ounces of wine, or 1 ounces of hard liquor.  Do not use street drugs.  Do not share needles.  Ask your health care provider for help if you need support or information about quitting drugs.  Tell your health care provider if you often feel depressed.  Tell your health care provider if you have ever been abused or do not feel safe at home.   This information is not intended to replace advice given to you by your health care provider. Make sure you discuss any questions you have with your health care provider.   Document Released: 11/12/2010 Document Revised: 05/20/2014 Document Reviewed: 03/31/2013 [employer] Interactive Patient Education [employer].
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
## 2015-02-23 MED ORDER — VALSARTAN 80 MG PO TABS: 40.0000 mg | ORAL_TABLET | Freq: Every day | ORAL | Status: DC

## 2015-03-15 ENCOUNTER — Telehealth: Payer: Self-pay | Admitting: Internal Medicine

## 2015-03-15 ENCOUNTER — Emergency Department (HOSPITAL_COMMUNITY)
Admission: EM | Admit: 2015-03-15 | Discharge: 2015-03-15 | Disposition: A | Discharge status: Home / Self Care | Payer: BLUE CROSS/BLUE SHIELD | Attending: Emergency Medicine | Admitting: Emergency Medicine

## 2015-03-15 ENCOUNTER — Emergency Department (HOSPITAL_COMMUNITY): Payer: BLUE CROSS/BLUE SHIELD

## 2015-03-15 ENCOUNTER — Encounter (HOSPITAL_COMMUNITY): Payer: Self-pay | Admitting: *Deleted

## 2015-03-15 DIAGNOSIS — R42 Dizziness and giddiness: Secondary | ICD-10-CM | POA: Insufficient documentation

## 2015-03-15 DIAGNOSIS — Z8742 Personal history of other diseases of the female genital tract: Secondary | ICD-10-CM | POA: Insufficient documentation

## 2015-03-15 DIAGNOSIS — R079 Chest pain, unspecified: Secondary | ICD-10-CM | POA: Diagnosis not present

## 2015-03-15 DIAGNOSIS — Z79818 Long term (current) use of other agents affecting estrogen receptors and estrogen levels: Secondary | ICD-10-CM | POA: Insufficient documentation

## 2015-03-15 DIAGNOSIS — Z8701 Personal history of pneumonia (recurrent): Secondary | ICD-10-CM | POA: Diagnosis not present

## 2015-03-15 DIAGNOSIS — R0602 Shortness of breath: Secondary | ICD-10-CM | POA: Diagnosis not present

## 2015-03-15 DIAGNOSIS — R000 Tachycardia, unspecified: Secondary | ICD-10-CM | POA: Diagnosis not present

## 2015-03-15 DIAGNOSIS — Z87891 Personal history of nicotine dependence: Secondary | ICD-10-CM | POA: Diagnosis not present

## 2015-03-15 DIAGNOSIS — Z872 Personal history of diseases of the skin and subcutaneous tissue: Secondary | ICD-10-CM | POA: Insufficient documentation

## 2015-03-15 DIAGNOSIS — R002 Palpitations: Secondary | ICD-10-CM | POA: Insufficient documentation

## 2015-03-15 DIAGNOSIS — Z8719 Personal history of other diseases of the digestive system: Secondary | ICD-10-CM | POA: Insufficient documentation

## 2015-03-15 DIAGNOSIS — Z79899 Other long term (current) drug therapy: Secondary | ICD-10-CM | POA: Diagnosis not present

## 2015-03-15 LAB — CBC
HCT: 42.9 % (ref 36.0–46.0)
Hemoglobin: 14.1 g/dL (ref 12.0–15.0)
MCH: 32 pg (ref 26.0–34.0)
MCHC: 32.9 g/dL (ref 30.0–36.0)
MCV: 97.5 fL (ref 78.0–100.0)
Platelets: 243 10*3/uL (ref 150–400)
RBC: 4.4 MIL/uL (ref 3.87–5.11)
RDW: 12 % (ref 11.5–15.5)
WBC: 9 10*3/uL (ref 4.0–10.5)

## 2015-03-15 LAB — BASIC METABOLIC PANEL
Anion gap: 11 (ref 5–15)
BUN: 16 mg/dL (ref 6–20)
CO2: 24 mmol/L (ref 22–32)
Calcium: 9.4 mg/dL (ref 8.9–10.3)
Chloride: 103 mmol/L (ref 101–111)
Creatinine, Ser: 0.77 mg/dL (ref 0.44–1.00)
GFR calc Af Amer: >60 mL/min (ref >60)
GFR calc non Af Amer: >60 mL/min (ref >60)
Glucose, Bld: 104 mg/dL — ABNORMAL HIGH (ref 65–99)
Potassium: 3.7 mmol/L (ref 3.5–5.1)
Sodium: 138 mmol/L (ref 135–145)

## 2015-03-15 LAB — D-DIMER, QUANTITATIVE: D-Dimer, Quant: <0.27 ug/mL-FEU (ref 0.00–0.48)

## 2015-03-15 LAB — PROTIME-INR
INR: 1.02 (ref 0.00–1.49)
Prothrombin Time: 13.6 seconds (ref 11.6–15.2)

## 2015-03-15 LAB — I-STAT TROPONIN, ED
Troponin i, poc: 0 ng/mL (ref 0.00–0.08)
Troponin i, poc: 0 ng/mL (ref 0.00–0.08)

## 2015-03-15 LAB — APTT: aPTT: 28 seconds (ref 24–37)

## 2015-03-15 LAB — BRAIN NATRIURETIC PEPTIDE: B Natriuretic Peptide: 20.3 pg/mL (ref 0.0–100.0)

## 2015-03-15 MED ORDER — ASPIRIN 81 MG PO CHEW
324.0000 mg | CHEWABLE_TABLET | Freq: Once | ORAL | Status: AC
Start: 2015-03-15 — End: 2015-03-15
  Administered 2015-03-15: 324 mg via ORAL
  Filled 2015-03-15: qty 4

## 2015-03-15 MED ORDER — SODIUM CHLORIDE 0.9 % IV SOLN
1000.0000 mL | INTRAVENOUS | Status: DC
  Administered 2015-03-15: 1000 mL via INTRAVENOUS

## 2015-03-15 NOTE — Telephone Encounter (Signed)
Patient Name: [name] [name]  DOB: 02/11/1966    Initial Comment Caller states she is on a BP medication, she is having heart flutters, tired, couldn't catch her breath, dizzy and left arm hot feeling.    Nurse Assessment  Nurse: Renaldo FiddlerAdkins, RN, Raynelle FanningJulie Date/Time Lamount Cohen(Eastern Time): 03/15/2015 1:15:45 PM  Confirm and document reason for call. If symptomatic, describe symptoms. ---Caller states she has been having heart fluttering for the last 2 weeks was seen and put on Diovan. She has been tired, had 2 episodes on Tuesday of fluttering, she could not catch her breathe, her left arm felt "hot" and she was dizzy. She has just had another episode, the dizziness was worse ,she can not take a deep breathe, she is sweating and feels like she is going to pass out. Denies chest pain, fluttering or left arm sx.  Has the patient traveled out of the country within the last 30 days? ---Not Applicable  Does the patient have any new or worsening symptoms? ---Yes  Will a triage be completed? ---Yes  Related visit to physician within the last 2 weeks? ---Yes   Started htn meds/ heart fluttering  Does the PT have any chronic conditions? (i.e. diabetes, asthma, etc.) ---Yes  List chronic conditions. ---GERD, HRT, Htn  Did the patient indicate they were pregnant? ---No     Guidelines    Guideline Title Affirmed Question Affirmed Notes  Dizziness - Lightheadedness Sounds like a life-threatening emergency to the triager To dizzy to stand, sweating   Final Disposition User   Call EMS 911 Now Renaldo FiddlerAdkins, RN, Raynelle FanningJulie    Disagree/Comply: Disagree  Disagree/Comply Reason: Disagree with instructions

## 2015-03-15 NOTE — Discharge Instructions (Signed)

## 2015-03-15 NOTE — ED Provider Notes (Signed)
CSN: [id number]     Arrival date & time 03/15/15  1351 History   First MD Initiated Contact with Patient 03/15/15 1456     Chief Complaint  Patient presents with  . Chest Pain  . Shortness of Breath    HPI Pt was doing work in the house and she suddenly felt weak all over.  Her heart started fluttering.  She felt burning in her arm.  The episode lasted a few minutes. She went to work today and the symptoms returned.  She feels hot.  She has been lightheaded.  It increased when she tires to take a deep breath. NO history of heart disease.  NO history of DVT.  NO fever.  NO leg swelling.  NO cough. Past Medical History  Diagnosis Date  . Acne     accutane in 2000  . Back pain   . Neck pain   . Endometriosis   . Bowel perforation (HCC)     at pelvic surgery   3 2012    . Right lower lobe pneumonia 11/07/2014    x ray finding    Past Surgical History  Procedure Laterality Date  . Abdominal hysterectomy    . Left ovary removed    . Lithotripsy    . Exploratory laparotomy    . Right ovary removed  08/10/10    complication bowel perforation   Family History  Problem Relation Age of Onset  . Healthy Mother   . Healthy Father   . Vasculitis Sister   . Coronary artery disease Maternal Grandmother   . Other      hashimotos thyroiditis   Social History  Substance Use Topics  . Smoking status: Former Games developer  . Smokeless tobacco: Never Used  . Alcohol Use: 0.0 oz/week    0 Standard drinks or equivalent per week   OB History    No data available     Review of Systems  All other systems reviewed and are negative.     Allergies  Sertraline hcl  Home Medications   Prior to Admission medications   Medication Sig Start Date End Date Taking? Authorizing Provider  estradiol (ESTRACE) 2 MG tablet Take 2 mg by mouth daily.      Richarda Overlie, MD  estradiol (VIVELLE-DOT) 0.1 MG/24HR patch Place 1 patch onto the skin 2 (two) times a week.    Historical Provider, MD   lansoprazole (PREVACID SOLUTAB) 15 MG disintegrating tablet Take 15 mg by mouth daily at 12 noon.    Historical Provider, MD  valsartan (DIOVAN) 80 MG tablet Take 0.5 tablets (40 mg total) by mouth daily. 02/23/15   Madelin Headings, MD   BP 132/88 mmHg  Pulse 71  Temp(Src) 97.9 F (36.6 C) (Oral)  Resp 16  SpO2 97% Physical Exam  Constitutional: She appears well-developed and well-nourished. No distress.  HENT:  Head: Normocephalic and atraumatic.  Right Ear: External ear normal.  Left Ear: External ear normal.  Eyes: Conjunctivae are normal. Right eye exhibits no discharge. Left eye exhibits no discharge. No scleral icterus.  Neck: Neck supple. No tracheal deviation present.  Cardiovascular: Normal rate, regular rhythm and intact distal pulses.   Pulmonary/Chest: Effort normal and breath sounds normal. No stridor. No respiratory distress. She has no wheezes. She has no rales.  Abdominal: Soft. Bowel sounds are normal. She exhibits no distension. There is no tenderness. There is no rebound and no guarding.  Musculoskeletal: She exhibits no edema or tenderness.  Neurological: She  is alert. She has normal strength. No cranial nerve deficit (no facial droop, extraocular movements intact, no slurred speech) or sensory deficit. She exhibits normal muscle tone. She displays no seizure activity. Coordination normal.  Skin: Skin is warm and dry. No rash noted.  Psychiatric: She has a normal mood and affect.  Nursing note and vitals reviewed.   ED Course  Procedures (including critical care time) Labs Review Labs Reviewed  BASIC METABOLIC PANEL - Abnormal; Notable for the following:    Glucose, Bld 104 (*)    All other components within normal limits  CBC  APTT  PROTIME-INR  D-DIMER, QUANTITATIVE (NOT AT [facility])  BRAIN NATRIURETIC PEPTIDE  I-STAT TROPOININ, ED  Rosezena SensorI-STAT TROPOININ, ED    Imaging Review Dg Chest 2 View  03/15/2015  CLINICAL DATA:  49 year old female with a history of  chest pressure and shortness of breath EXAM: CHEST - 2 VIEW COMPARISON:  12/13/2014 FINDINGS: Cardiomediastinal silhouette projects within normal limits in size and contour. No confluent airspace disease, pneumothorax, or pleural effusion. No displaced fracture. Unremarkable appearance of the upper abdomen. IMPRESSION: No radiographic evidence of acute cardiopulmonary disease. Signed, Yvone NeuJaime S. Loreta AveWagner, DO [facility] Electronically Signed   By: Gilmer MorJaime  Wagner D.O.   On: 03/15/2015 14:44   I have personally reviewed and evaluated these images and lab results as part of my medical decision-making.   EKG Interpretation   Date/Time:  Wednesday March 15 2015 13:57:10 EDT Ventricular Rate:  67 PR Interval:  138 QRS Duration: 82 QT Interval:  392 QTC Calculation: 414 R Axis:   52 Text Interpretation:  Normal sinus rhythm with sinus arrhythmia Since last  tracing rate slower Confirmed by Cathline Dowen  MD-J, Renisha Cockrum ([id number]) on 03/15/2015  4:52:26 PM      MDM   Final diagnoses:  Chest pain, unspecified chest pain type    Patient's symptoms are atypical for cardiac disease. She describes palpitations associated with her shortness of breath. Possible she is having episodes of tachycardia.   Doubt ACS or dangerous arrhythmia.  DIscussed close outpatient follow up with PCP to discuss further evaluation, ie cardiac monitoring.    Linwood DibblesJon Audi Wettstein, MD 03/17/15 1247

## 2015-03-15 NOTE — Telephone Encounter (Signed)
Pt seen in ED 

## 2015-03-15 NOTE — ED Notes (Signed)
Pt states that she has had chest pressure and sob since yesterday. Pt states that she has dizziness, lightheadedness, nausea.

## 2015-03-22 ENCOUNTER — Encounter: Payer: Self-pay | Admitting: Internal Medicine

## 2015-03-22 ENCOUNTER — Ambulatory Visit (INDEPENDENT_AMBULATORY_CARE_PROVIDER_SITE_OTHER): Payer: BLUE CROSS/BLUE SHIELD | Admitting: Internal Medicine

## 2015-03-22 VITALS — BP 136/90 | Temp 97.8°F | Wt 175.9 lb

## 2015-03-22 DIAGNOSIS — R000 Tachycardia, unspecified: Secondary | ICD-10-CM

## 2015-03-22 DIAGNOSIS — R0789 Other chest pain: Secondary | ICD-10-CM | POA: Diagnosis not present

## 2015-03-22 DIAGNOSIS — Z8349 Family history of other endocrine, nutritional and metabolic diseases: Secondary | ICD-10-CM | POA: Diagnosis not present

## 2015-03-22 DIAGNOSIS — R030 Elevated blood-pressure reading, without diagnosis of hypertension: Secondary | ICD-10-CM | POA: Diagnosis not present

## 2015-03-22 DIAGNOSIS — IMO0001 Reserved for inherently not codable concepts without codable children: Secondary | ICD-10-CM

## 2015-03-22 NOTE — Patient Instructions (Addendum)
   BP Readings from Last 3 Encounters:  03/22/15 136/90  03/15/15 116/77  02/22/15 122/92   Stay off the valsartan   Will do referral for the palpitations     And any  Opinion   about restarting a bp medication . Let us know in interim   Something new concerning comes up.
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
## 2015-04-11 ENCOUNTER — Ambulatory Visit: Payer: BLUE CROSS/BLUE SHIELD | Admitting: Internal Medicine

## 2015-05-24 ENCOUNTER — Ambulatory Visit: Payer: BLUE CROSS/BLUE SHIELD | Admitting: Internal Medicine

## 2015-06-09 ENCOUNTER — Ambulatory Visit: Payer: BLUE CROSS/BLUE SHIELD | Admitting: Internal Medicine

## 2015-09-05 ENCOUNTER — Encounter: Payer: Self-pay | Admitting: Gastroenterology

## 2015-11-02 DIAGNOSIS — M25572 Pain in left ankle and joints of left foot: Secondary | ICD-10-CM | POA: Diagnosis not present

## 2015-11-02 DIAGNOSIS — M7672 Peroneal tendinitis, left leg: Secondary | ICD-10-CM | POA: Diagnosis not present

## 2015-11-02 DIAGNOSIS — M6281 Muscle weakness (generalized): Secondary | ICD-10-CM | POA: Diagnosis not present

## 2015-11-08 DIAGNOSIS — H5213 Myopia, bilateral: Secondary | ICD-10-CM | POA: Diagnosis not present

## 2015-11-10 DIAGNOSIS — S86312A Strain of muscle(s) and tendon(s) of peroneal muscle group at lower leg level, left leg, initial encounter: Secondary | ICD-10-CM | POA: Diagnosis not present

## 2015-11-10 DIAGNOSIS — M25572 Pain in left ankle and joints of left foot: Secondary | ICD-10-CM | POA: Diagnosis not present

## 2015-11-13 DIAGNOSIS — S86312D Strain of muscle(s) and tendon(s) of peroneal muscle group at lower leg level, left leg, subsequent encounter: Secondary | ICD-10-CM | POA: Diagnosis not present

## 2015-11-20 DIAGNOSIS — S86312D Strain of muscle(s) and tendon(s) of peroneal muscle group at lower leg level, left leg, subsequent encounter: Secondary | ICD-10-CM | POA: Diagnosis not present

## 2015-12-21 DIAGNOSIS — S86312D Strain of muscle(s) and tendon(s) of peroneal muscle group at lower leg level, left leg, subsequent encounter: Secondary | ICD-10-CM | POA: Diagnosis not present

## 2015-12-21 DIAGNOSIS — M25572 Pain in left ankle and joints of left foot: Secondary | ICD-10-CM | POA: Diagnosis not present

## 2016-01-31 DIAGNOSIS — B078 Other viral warts: Secondary | ICD-10-CM | POA: Diagnosis not present

## 2016-04-11 DIAGNOSIS — M542 Cervicalgia: Secondary | ICD-10-CM | POA: Diagnosis not present

## 2016-04-17 DIAGNOSIS — M542 Cervicalgia: Secondary | ICD-10-CM | POA: Diagnosis not present

## 2016-04-25 DIAGNOSIS — M542 Cervicalgia: Secondary | ICD-10-CM | POA: Diagnosis not present

## 2016-05-23 DIAGNOSIS — M542 Cervicalgia: Secondary | ICD-10-CM | POA: Diagnosis not present

## 2016-05-25 DIAGNOSIS — M62838 Other muscle spasm: Secondary | ICD-10-CM | POA: Diagnosis not present

## 2016-05-26 DIAGNOSIS — G243 Spasmodic torticollis: Secondary | ICD-10-CM | POA: Diagnosis not present

## 2016-05-26 DIAGNOSIS — R030 Elevated blood-pressure reading, without diagnosis of hypertension: Secondary | ICD-10-CM | POA: Diagnosis not present

## 2016-06-20 DIAGNOSIS — R030 Elevated blood-pressure reading, without diagnosis of hypertension: Secondary | ICD-10-CM | POA: Diagnosis not present

## 2016-06-20 DIAGNOSIS — M542 Cervicalgia: Secondary | ICD-10-CM | POA: Diagnosis not present

## 2016-06-20 DIAGNOSIS — Z6830 Body mass index (BMI) 30.0-30.9, adult: Secondary | ICD-10-CM | POA: Diagnosis not present

## 2016-07-30 DIAGNOSIS — M542 Cervicalgia: Secondary | ICD-10-CM | POA: Diagnosis not present

## 2016-07-30 DIAGNOSIS — M47812 Spondylosis without myelopathy or radiculopathy, cervical region: Secondary | ICD-10-CM | POA: Diagnosis not present

## 2016-07-31 DIAGNOSIS — Z1231 Encounter for screening mammogram for malignant neoplasm of breast: Secondary | ICD-10-CM | POA: Diagnosis not present

## 2016-07-31 DIAGNOSIS — Z6829 Body mass index (BMI) 29.0-29.9, adult: Secondary | ICD-10-CM | POA: Diagnosis not present

## 2016-07-31 DIAGNOSIS — Z01419 Encounter for gynecological examination (general) (routine) without abnormal findings: Secondary | ICD-10-CM | POA: Diagnosis not present

## 2016-08-13 DIAGNOSIS — Z13220 Encounter for screening for lipoid disorders: Secondary | ICD-10-CM | POA: Diagnosis not present

## 2016-08-13 DIAGNOSIS — Z13228 Encounter for screening for other metabolic disorders: Secondary | ICD-10-CM | POA: Diagnosis not present

## 2016-08-20 DIAGNOSIS — M542 Cervicalgia: Secondary | ICD-10-CM | POA: Diagnosis not present

## 2016-08-20 DIAGNOSIS — M47812 Spondylosis without myelopathy or radiculopathy, cervical region: Secondary | ICD-10-CM | POA: Diagnosis not present

## 2016-09-09 DIAGNOSIS — Z1211 Encounter for screening for malignant neoplasm of colon: Secondary | ICD-10-CM | POA: Diagnosis not present

## 2016-09-09 DIAGNOSIS — K649 Unspecified hemorrhoids: Secondary | ICD-10-CM | POA: Diagnosis not present

## 2016-09-09 DIAGNOSIS — K640 First degree hemorrhoids: Secondary | ICD-10-CM | POA: Diagnosis not present

## 2016-09-09 LAB — HM COLONOSCOPY

## 2016-09-25 ENCOUNTER — Encounter: Payer: Self-pay | Admitting: Family Medicine

## 2016-12-05 ENCOUNTER — Encounter: Payer: Self-pay | Admitting: Internal Medicine

## 2016-12-05 ENCOUNTER — Ambulatory Visit (INDEPENDENT_AMBULATORY_CARE_PROVIDER_SITE_OTHER): Payer: BLUE CROSS/BLUE SHIELD | Admitting: Internal Medicine

## 2016-12-05 VITALS — BP 132/80 | HR 74 | Temp 98.2°F | Wt 183.7 lb

## 2016-12-05 DIAGNOSIS — J029 Acute pharyngitis, unspecified: Secondary | ICD-10-CM

## 2016-12-05 LAB — POCT RAPID STREP A (OFFICE): Rapid Strep A Screen: NEGATIVE

## 2016-12-05 MED ORDER — AMOXICILLIN 875 MG PO TABS: 875.0000 mg | ORAL_TABLET | Freq: Two times a day (BID) | ORAL | 0 refills | Status: DC

## 2016-12-05 NOTE — Patient Instructions (Addendum)
Your rapid strep is negative waiting on the culture test But because of the severity of the unilateral inflammation will empirically treat with antibiotic. Your lung exam is reassuring and clear. We'll let  You  know culture results    Fu if  Ongoing progressive  Or other  Concerns
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
## 2016-12-07 LAB — CULTURE, GROUP A STREP

## 2017-01-09 DIAGNOSIS — H5213 Myopia, bilateral: Secondary | ICD-10-CM | POA: Diagnosis not present

## 2017-01-31 ENCOUNTER — Encounter: Payer: Self-pay | Admitting: Internal Medicine

## 2017-02-24 ENCOUNTER — Ambulatory Visit (INDEPENDENT_AMBULATORY_CARE_PROVIDER_SITE_OTHER): Payer: BLUE CROSS/BLUE SHIELD | Admitting: Podiatry

## 2017-02-24 ENCOUNTER — Encounter: Payer: Self-pay | Admitting: Podiatry

## 2017-02-24 ENCOUNTER — Ambulatory Visit (INDEPENDENT_AMBULATORY_CARE_PROVIDER_SITE_OTHER): Payer: BLUE CROSS/BLUE SHIELD

## 2017-02-24 DIAGNOSIS — S93602A Unspecified sprain of left foot, initial encounter: Secondary | ICD-10-CM

## 2017-02-24 DIAGNOSIS — M79672 Pain in left foot: Secondary | ICD-10-CM

## 2017-02-24 DIAGNOSIS — M2580 Other specified joint disorders, unspecified joint: Secondary | ICD-10-CM | POA: Diagnosis not present

## 2017-02-24 MED ORDER — BETAMETHASONE SOD PHOS & ACET 6 (3-3) MG/ML IJ SUSP: 3.0000 mg | Freq: Once | INTRAMUSCULAR | Status: DC

## 2017-02-24 MED ORDER — DICLOFENAC SODIUM 75 MG PO TBEC: 75.0000 mg | DELAYED_RELEASE_TABLET | Freq: Two times a day (BID) | ORAL | 0 refills | Status: DC

## 2017-02-24 NOTE — Progress Notes (Addendum)
   HPI: 51 year old otherwise healthy female presents to the office today for evaluation of an injury to the left foot. Patient currently has 2 different issues. She has experienced pain and tenderness to the plantar aspect of her left great toe for the past year. Patient is regular runner. She denies any significant trauma to the left great toe. Also this weekend the patient was taking a tour of the [employer] when she stepped on a core and felt an immediate pop to her left foot.   patient immediately noticed significant pain and tenderness. She presents today for further treatment and evaluation   Past Medical History:  Diagnosis Date  . Acne    accutane in 2000  . Back pain   . Bowel perforation (HCC)    at pelvic surgery   3 2012    . Endometriosis   . Neck pain   . Right lower lobe pneumonia (HCC) 11/07/2014   x ray finding      Physical Exam: General: The patient is alert and oriented x3 in no acute distress.  Dermatology: Skin is warm, dry and supple bilateral lower extremities. Negative for open lesions or macerations.  Vascular: Palpable pedal pulses bilaterally. No edema or erythema noted. Capillary refill within normal limits.  Neurological: Epicritic and protective threshold grossly intact bilaterally.   Musculoskeletal Exam:  significant pain on palpation to the tibial sesamoid of the left foot with minimal edema noted. There is also pain on palpation with minimal edema and swelling noted to the midfoot left foot Range of motion within normal limits to all pedal and ankle joints bilateral. Muscle strength 5/5 in all groups bilateral.   Radiographic Exam:  Normal osseous mineralization. Joint spaces preserved. suspect tibial sesamoid fracture left foot versus bipartite sesamoid. This likely represents a chronic fracture. It is not acute in nature and the patient has experienced symptoms for the past year  Assessment: -   sesamoiditis left foot possible tibial sesamoid  fracture  - Left foot sprain    Plan of Care:  - Patient was evaluated today. X-rays were reviewed today. -  weightbearing as tolerated in a cam boot. Patient has a cam boot at home  -  prescription for diclofenac 75 mg -  injection of 0.5 mL Celestone Soluspan injected the sesamoidal apparatus left foot  - Appointment with Raiford Noble for custom molded orthotics with the sesamoidal apparatus offload  - Return to clinic in 4 weeks    rugged maniac 5K in 2 weeks   Felecia Shelling, DPM [facility]  Dr. Felecia Shelling, DPM    [address] [place], [place] [address]                Office [phone]  Fax [phone]

## 2017-02-25 ENCOUNTER — Other Ambulatory Visit: Payer: Self-pay | Admitting: Podiatry

## 2017-02-25 DIAGNOSIS — S93602A Unspecified sprain of left foot, initial encounter: Secondary | ICD-10-CM

## 2017-02-26 ENCOUNTER — Ambulatory Visit: Payer: Self-pay | Admitting: Podiatry

## 2017-03-04 ENCOUNTER — Ambulatory Visit (INDEPENDENT_AMBULATORY_CARE_PROVIDER_SITE_OTHER): Payer: BLUE CROSS/BLUE SHIELD | Admitting: Orthotics

## 2017-03-04 ENCOUNTER — Ambulatory Visit: Payer: Self-pay | Admitting: Podiatry

## 2017-03-04 DIAGNOSIS — S93602A Unspecified sprain of left foot, initial encounter: Secondary | ICD-10-CM

## 2017-03-04 DIAGNOSIS — M2580 Other specified joint disorders, unspecified joint: Secondary | ICD-10-CM | POA: Diagnosis not present

## 2017-03-04 NOTE — Progress Notes (Signed)
Patient presents today for CMFO per Dr. Logan BoresEvans.  Patient has hx of tibia sesamoid fx and sesmoiditis.  Also she is experience pain/swelling lateral aspec to left ankle.  Plan on f/o to offload sesamoids (k-wedge) and lateral wedge rf and ff..Also, raise heel 1/8"  Raiford Nobleick

## 2017-03-10 ENCOUNTER — Ambulatory Visit (INDEPENDENT_AMBULATORY_CARE_PROVIDER_SITE_OTHER): Payer: BLUE CROSS/BLUE SHIELD

## 2017-03-10 ENCOUNTER — Encounter: Payer: Self-pay | Admitting: Podiatry

## 2017-03-10 ENCOUNTER — Ambulatory Visit (INDEPENDENT_AMBULATORY_CARE_PROVIDER_SITE_OTHER): Payer: BLUE CROSS/BLUE SHIELD | Admitting: Podiatry

## 2017-03-10 DIAGNOSIS — S93602D Unspecified sprain of left foot, subsequent encounter: Secondary | ICD-10-CM

## 2017-03-10 DIAGNOSIS — M2580 Other specified joint disorders, unspecified joint: Secondary | ICD-10-CM | POA: Diagnosis not present

## 2017-03-10 NOTE — Progress Notes (Signed)
   HPI: 51 year old female presents the office today for follow-up evaluation and treatment regarding left foot and ankle pain. Patient states that she continues to experience swelling with significant amount of pain to the left foot despite immobilization in a cam boot. Patient states that she now experiences pain and swelling with bruising to different areas of her foot and ankle. She presents for further treatment and evaluation  Past Medical History:  Diagnosis Date  . Acne    accutane in 2000  . Back pain   . Bowel perforation (HCC)    at pelvic surgery   3 2012    . Endometriosis   . Neck pain   . Right lower lobe pneumonia (HCC) 11/07/2014   x ray finding      Physical Exam: General: The patient is alert and oriented x3 in no acute distress.  Dermatology: Skin is warm, dry and supple bilateral lower extremities. Negative for open lesions or macerations.  Vascular: Palpable pedal pulses bilaterally. No edema or erythema noted. Capillary refill within normal limits.  Neurological: Epicritic and protective threshold grossly intact bilaterally.   Musculoskeletal Exam: diffuse pain on palpation with edema noted to the left foot and ankle. Pain is most significant on the plantar arch the left foot as well as along the peroneal tendon sheath left. Suspect possible plantar fascial rupture and peroneal tendinitis.  Radiographic Exam:  Normal osseous mineralization. Joint spaces preserved. No fracture/dislocation/boney destruction.    Assessment: -  Soft tissue injury subsequent encounter left foot   Plan of Care:  - patient evaluated today. X-rays reviewed today. - due to the nonprogressive nature of the symptoms and acute swelling with injury we will order an MRI of the left foot - continue immobilization cam boot -  Return to clinic in 3 weeks to review MRI results   Felecia ShellingBrent M. Travares Nelles, DPM [facility]  Dr. Felecia ShellingBrent M. Paisli Silfies, DPM    [address]                                         [place], [place] [id number]                Office [phone]  Fax [phone]

## 2017-03-11 ENCOUNTER — Telehealth: Payer: Self-pay | Admitting: *Deleted

## 2017-03-11 DIAGNOSIS — S93602D Unspecified sprain of left foot, subsequent encounter: Secondary | ICD-10-CM

## 2017-03-11 DIAGNOSIS — M2580 Other specified joint disorders, unspecified joint: Secondary | ICD-10-CM

## 2017-03-11 NOTE — Telephone Encounter (Signed)
Orders to [facility] for pre-cert and faxed to [facility]. 

## 2017-03-11 NOTE — Telephone Encounter (Signed)
-----   Message from Felecia ShellingBrent M Evans, DPM sent at 03/10/2017  5:04 PM EDT ----- Regarding: MRI left foot Please order an MRI left foot with or without contrast.  Diagnosis: Peroneal tendinitis left, possible plantar fascial rupture left, generalized foot and ankle edema left  Thanks, Dr. Logan BoresEvans

## 2017-03-18 NOTE — Telephone Encounter (Signed)
Sure, please provide Vicodin 5/325mg  #30.  Thanks, Dr. Logan BoresEvans

## 2017-03-18 NOTE — Telephone Encounter (Addendum)
Pt states she was wearing the boot and stepped in the yard on a root and heard and felt a tearing sound and she is having extreme pain that is not helped by Diclofenac, and would like something stronger. I spoke with pt and she had not had the MRI yet, and I told her I would have to ask Dr. Logan BoresEvans for a pain medication and call again. I told pt to call and [facility]. I [facility] the AIM Prior Authorization [facility] and will call to see if date of service needs to be changed since was 03/14/2017. I offered pt an appt and she said she feels like it is right there over the tendon just like last year. I told pt I would inform Dr. Logan BoresEvans and see if he wanted her to come in.

## 2017-03-19 MED ORDER — HYDROCODONE-ACETAMINOPHEN 5-325 MG PO TABS: 1.0000 | ORAL_TABLET | Freq: Four times a day (QID) | ORAL | 0 refills | Status: DC | PRN

## 2017-03-19 NOTE — Addendum Note (Signed)
Addended by: Alphia Kava'CONNELL, Eliana Lueth D on: 03/19/2017 03:17 PM   Modules accepted: Orders

## 2017-03-19 NOTE — Telephone Encounter (Signed)
I informed [name] [name] Dr. Logan BoresEvans had ordered Vicodin 5/325mg  #30 one tablet every 6 hours prn foot pain. [name] [name] states it was so close to 5pm yesterday she will schedule the MRI today and pick up the rx tomorrow because she is out of town.

## 2017-03-24 ENCOUNTER — Other Ambulatory Visit: Payer: BLUE CROSS/BLUE SHIELD | Admitting: Orthotics

## 2017-03-24 ENCOUNTER — Ambulatory Visit: Payer: BLUE CROSS/BLUE SHIELD | Admitting: Podiatry

## 2017-03-31 ENCOUNTER — Ambulatory Visit
Admission: RE | Admit: 2017-03-31 | Discharge: 2017-03-31 | Disposition: A | Discharge status: Home / Self Care | Payer: BLUE CROSS/BLUE SHIELD | Source: Ambulatory Visit | Attending: Podiatry | Admitting: Podiatry

## 2017-03-31 DIAGNOSIS — M7989 Other specified soft tissue disorders: Secondary | ICD-10-CM | POA: Diagnosis not present

## 2017-04-07 ENCOUNTER — Ambulatory Visit: Payer: BLUE CROSS/BLUE SHIELD | Admitting: Podiatry

## 2017-04-07 ENCOUNTER — Ambulatory Visit (INDEPENDENT_AMBULATORY_CARE_PROVIDER_SITE_OTHER): Payer: BLUE CROSS/BLUE SHIELD | Admitting: Orthotics

## 2017-04-07 DIAGNOSIS — S86312D Strain of muscle(s) and tendon(s) of peroneal muscle group at lower leg level, left leg, subsequent encounter: Secondary | ICD-10-CM

## 2017-04-07 DIAGNOSIS — S93602D Unspecified sprain of left foot, subsequent encounter: Secondary | ICD-10-CM

## 2017-04-07 DIAGNOSIS — M2580 Other specified joint disorders, unspecified joint: Secondary | ICD-10-CM

## 2017-04-07 DIAGNOSIS — S93602A Unspecified sprain of left foot, initial encounter: Secondary | ICD-10-CM

## 2017-04-07 NOTE — Progress Notes (Signed)
Patient came in today to pick up custom made foot orthotics.  The goals were accomplished and the patient reported no dissatisfaction with said orthotics.  Patient was advised of breakin period and how to report any issues. 

## 2017-04-07 NOTE — Patient Instructions (Signed)
Pre-Operative Instructions  Congratulations, you have decided to take an important step towards improving your quality of life.  You can be assured that the doctors and staff at [facility] will be with you every step of the way.  Here are some important things you should know:  1. Plan to be at the surgery center/hospital at least 1 (one) hour prior to your scheduled time, unless otherwise directed by the surgical center/hospital staff.  You must have a responsible adult accompany you, remain during the surgery and drive you home.  Make sure you have directions to the surgical center/hospital to ensure you arrive on time. 2. If you are having surgery at [facility] or [facility], you will need a copy of your medical history and physical form from your family physician within one month prior to the date of surgery. We will give you a form for your primary physician to complete.  3. We make every effort to accommodate the date you request for surgery.  However, there are times where surgery dates or times have to be moved.  We will contact you as soon as possible if a change in schedule is required.   4. No aspirin/ibuprofen for one week before surgery.  If you are on aspirin, any non-steroidal anti-inflammatory medications (Mobic, Aleve, Ibuprofen) should not be taken seven (7) days prior to your surgery.  You make take Tylenol for pain prior to surgery.  5. Medications - If you are taking daily heart and blood pressure medications, seizure, reflux, allergy, asthma, anxiety, pain or diabetes medications, make sure you notify the surgery center/hospital before the day of surgery so they can tell you which medications you should take or avoid the day of surgery. 6. No food or drink after midnight the night before surgery unless directed otherwise by surgical center/hospital staff. 7. No alcoholic beverages 24-hours prior to surgery.  No smoking 24-hours prior or 24-hours after  surgery. 8. Wear loose pants or shorts. They should be loose enough to fit over bandages, boots, and casts. 9. Don't wear slip-on shoes. Sneakers are preferred. 10. Bring your boot with you to the surgery center/hospital.  Also bring crutches or a walker if your physician has prescribed it for you.  If you do not have this equipment, it will be provided for you after surgery. 11. If you have not been contacted by the surgery center/hospital by the day before your surgery, call to confirm the date and time of your surgery. 12. Leave-time from work may vary depending on the type of surgery you have.  Appropriate arrangements should be made prior to surgery with your employer. 13. Prescriptions will be provided immediately following surgery by your doctor.  Fill these as soon as possible after surgery and take the medication as directed. Pain medications will not be refilled on weekends and must be approved by the doctor. 14. Remove nail polish on the operative foot and avoid getting pedicures prior to surgery. 15. Wash the night before surgery.  The night before surgery wash the foot and leg well with water and the antibacterial soap provided. Be sure to pay special attention to beneath the toenails and in between the toes.  Wash for at least three (3) minutes. Rinse thoroughly with water and dry well with a towel.  Perform this wash unless told not to do so by your physician.  Enclosed: 1 Ice pack (please put in freezer the night before surgery)   1 Hibiclens skin cleaner     Pre-op instructions  If you have any questions regarding the instructions, please do not hesitate to call our office.  [place]: [address], [place], [place] [address] -- [phone]  [place]: [address] [place], [place] [address] -- [phone]  [place]: [address]  [place], [place] [address] -- [phone]  [place]: [address], [address], [place], [place] [address] -- [phone]  Website:  [email or web address] 

## 2017-04-10 NOTE — Progress Notes (Signed)
   HPI: 51 year old female presents the office today for follow-up evaluation and treatment regarding left foot and ankle pain. She recently had on MRI done on 03/31/17 and is here to review the results as well as for further treatment and evaluation.   Past Medical History:  Diagnosis Date  . Acne    accutane in 2000  . Back pain   . Bowel perforation (HCC)    at pelvic surgery   3 2012    . Endometriosis   . Neck pain   . Right lower lobe pneumonia (HCC) 11/07/2014   x ray finding      Physical Exam: General: The patient is alert and oriented x3 in no acute distress.  Dermatology: Skin is warm, dry and supple bilateral lower extremities. Negative for open lesions or macerations.  Vascular: Palpable pedal pulses bilaterally. No edema or erythema noted. Capillary refill within normal limits.  Neurological: Epicritic and protective threshold grossly intact bilaterally.   Musculoskeletal Exam: Diffuse pain on palpation with edema noted to the left foot and ankle. Pain is most significant on the plantar arch the left foot as well as along the peroneal tendon sheath left. Suspect possible plantar fascial rupture and peroneal tendinitis.  MRI Impression:  1. Severe tendinopathy and tenosynovitis involving the peroneal tendons. The peroneus longus tendon is ruptured distally. The peroneus brevis tendon is intact but demonstrates interstitial tears and longitudinal split type tears. 2. Intact medial and lateral ankle ligaments and medial ankle tendons. 3. No significant degenerative changes or stress fracture.  Assessment: - Soft tissue injury subsequent encounter left foot - Peroneal tendon rupture left   Plan of Care:  - patient evaluated today. MRI reviewed today. - Today we discussed the conservative versus surgical management of the presenting pathology. The patient opts for surgical management. All possible complications and details of the procedure were explained. All patient  questions were answered. No guarantees were expressed or implied. - Authorization for surgery was initiated today. Surgery will consist of repair peroneus brevis tendon left, repair peroneus longus tendon left. Possible tenodesis peroneal tendons left. - Return to clinic 1 week post op.    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    Felecia ShellingBrent M. Evans, DPM [facility]  Dr. Felecia ShellingBrent M. Evans, DPM    [address]                                        [place], [place] [address]                Office [phone](336) [phone]  Fax [phone](336) [phone]

## 2017-04-28 ENCOUNTER — Telehealth: Payer: Self-pay | Admitting: Podiatry

## 2017-04-28 NOTE — Telephone Encounter (Signed)
I'm scheduled to have surgery next Thursday with Dr. Logan BoresEvans. I only have 2 hydrocodone pills left and I wanted to know if he would give me some to get me through until I have surgery next week. Please call me back at [phone]. Thank you.

## 2017-04-30 MED ORDER — HYDROCODONE-ACETAMINOPHEN 5-325 MG PO TABS: 1.0000 | ORAL_TABLET | Freq: Four times a day (QID) | ORAL | 0 refills | Status: DC | PRN

## 2017-04-30 NOTE — Telephone Encounter (Signed)
I informed pt of Dr. Logan BoresEvans orders and that she would need to pickup the rx in the [place] office.

## 2017-04-30 NOTE — Addendum Note (Signed)
Addended by: Alphia Kava'CONNELL, VALERY D on: 04/30/2017 09:12 AM   Modules accepted: Orders

## 2017-04-30 NOTE — Telephone Encounter (Signed)
Okay to refill? 

## 2017-05-08 ENCOUNTER — Encounter: Payer: Self-pay | Admitting: Podiatry

## 2017-05-08 DIAGNOSIS — M66272 Spontaneous rupture of extensor tendons, left ankle and foot: Secondary | ICD-10-CM | POA: Diagnosis not present

## 2017-05-08 DIAGNOSIS — M25572 Pain in left ankle and joints of left foot: Secondary | ICD-10-CM | POA: Diagnosis not present

## 2017-05-08 DIAGNOSIS — S86312A Strain of muscle(s) and tendon(s) of peroneal muscle group at lower leg level, left leg, initial encounter: Secondary | ICD-10-CM | POA: Diagnosis not present

## 2017-05-08 DIAGNOSIS — K219 Gastro-esophageal reflux disease without esophagitis: Secondary | ICD-10-CM | POA: Diagnosis not present

## 2017-05-08 DIAGNOSIS — M2580 Other specified joint disorders, unspecified joint: Secondary | ICD-10-CM | POA: Diagnosis not present

## 2017-05-08 DIAGNOSIS — S86312D Strain of muscle(s) and tendon(s) of peroneal muscle group at lower leg level, left leg, subsequent encounter: Secondary | ICD-10-CM | POA: Diagnosis not present

## 2017-05-09 ENCOUNTER — Telehealth: Payer: Self-pay | Admitting: Podiatry

## 2017-05-09 MED ORDER — MELOXICAM 15 MG PO TABS: 15.0000 mg | ORAL_TABLET | Freq: Every day | ORAL | 0 refills | Status: DC

## 2017-05-09 NOTE — Telephone Encounter (Signed)
Rx for meloxicam sent to [facility]. I informed pt and she said she had picked it up, but I did not see in the orders.

## 2017-05-09 NOTE — Telephone Encounter (Signed)
Pt asked if she could take her boot off, and I told her if she knew she was not going to go to sleep she could take it off, or just open it up, but she must have it on to sleep and to walk. Pt states she was told to use the ice pack but she has on a thick dressing, I told her to place the ice pack behind her knee covered with a light fabric and it would cool the blood going to the foot and decrease swelling and discomfort. Pt states she was told to take all of her antibiotic, but did not receive any, I told her certain doctors do not give antibiotics but I would let Dr. Logan BoresEvans know she didn't get an antibiotic.

## 2017-05-09 NOTE — Telephone Encounter (Signed)
I just had ankle surgery yesterday by Dr. Logan BoresEvans and I just have a couple of questions. If somebody can call me back at [phone]. Thank you.

## 2017-05-09 NOTE — Telephone Encounter (Signed)
No antibiotic. They must have meant antiinflammatory. I wrote her for percocet, meloxicam, and zofran. Thanks, Dr. Logan BoresEvans

## 2017-05-09 NOTE — Addendum Note (Signed)
Addended by: Alphia Kava'CONNELL, VALERY D on: 05/09/2017 04:11 PM   Modules accepted: Orders

## 2017-05-14 ENCOUNTER — Ambulatory Visit (INDEPENDENT_AMBULATORY_CARE_PROVIDER_SITE_OTHER): Payer: BLUE CROSS/BLUE SHIELD | Admitting: Podiatry

## 2017-05-14 ENCOUNTER — Telehealth: Payer: Self-pay | Admitting: *Deleted

## 2017-05-14 ENCOUNTER — Encounter: Payer: Self-pay | Admitting: Podiatry

## 2017-05-14 VITALS — BP 129/72 | HR 75 | Temp 98.4°F

## 2017-05-14 DIAGNOSIS — Z9889 Other specified postprocedural states: Secondary | ICD-10-CM

## 2017-05-14 MED ORDER — OXYCODONE-ACETAMINOPHEN 5-325 MG PO TABS: 1.0000 | ORAL_TABLET | Freq: Three times a day (TID) | ORAL | 0 refills | Status: DC | PRN

## 2017-05-14 NOTE — Telephone Encounter (Signed)
-----   Message from Felecia ShellingBrent M Evans, DPM sent at 05/14/2017 10:17 AM EST ----- Regarding: Knee scooter Please get authorization for knee scooter.  Dx: post op tendon repair left. Strict NWB x 4 weeks.   Thanks, Dr. Logan BoresEvans

## 2017-05-14 NOTE — Telephone Encounter (Signed)
Faxed orders for knee scooter to [facility].

## 2017-05-16 DIAGNOSIS — M25572 Pain in left ankle and joints of left foot: Secondary | ICD-10-CM | POA: Diagnosis not present

## 2017-05-17 NOTE — Progress Notes (Signed)
   Subjective:  Patient presents today status post peroneal tendon repair of the left lower extremity. DOS: 05/08/17.  She states she is doing well.  She is requesting a refill on her pain medication.  Patient is here for further evaluation and treatment.   Past Medical History:  Diagnosis Date  . Acne    accutane in 2000  . Back pain   . Bowel perforation (HCC)    at pelvic surgery   3 2012    . Endometriosis   . Neck pain   . Right lower lobe pneumonia (HCC) 11/07/2014   x ray finding       Objective/Physical Exam Skin incisions appear to be well coapted with sutures and staples intact. No sign of infectious process noted. No dehiscence. No active bleeding noted. Moderate edema noted to the surgical extremity.  Assessment: 1. s/p peroneal tendon repair of the left lower extremity. DOS: 05/08/17   Plan of Care:  1.  Patient was evaluated.  2.  Dressing change. 3.  Continue nonweightbearing in cam boot. 4.  Refill prescription for Percocet 5/325 mg provided to patient. 5.  Disability parking placard filled out today. 6.  Return to clinic in 1 week.    Felecia ShellingBrent M. Evans, DPM [facility]  Dr. Felecia ShellingBrent M. Evans, DPM    [address]                                        [place], [place] [address]                Office [phone](336) [phone]  Fax [phone](336) [phone]

## 2017-05-21 ENCOUNTER — Ambulatory Visit (INDEPENDENT_AMBULATORY_CARE_PROVIDER_SITE_OTHER): Payer: BLUE CROSS/BLUE SHIELD | Admitting: Podiatry

## 2017-05-21 DIAGNOSIS — Z9889 Other specified postprocedural states: Secondary | ICD-10-CM

## 2017-05-25 NOTE — Progress Notes (Signed)
   Subjective:  Patient presents today status post peroneal tendon repair of the left lower extremity. DOS: 05/08/17.  She states she is doing very well.  He reports some associated swelling of the foot but states she went to a concert last night.  Patient is here for further evaluation and treatment.   Past Medical History:  Diagnosis Date  . Acne    accutane in 2000  . Back pain   . Bowel perforation (HCC)    at pelvic surgery   3 2012    . Endometriosis   . Neck pain   . Right lower lobe pneumonia (HCC) 11/07/2014   x ray finding       Objective/Physical Exam Skin incisions appear to be well coapted with sutures and staples intact. No sign of infectious process noted. No dehiscence. No active bleeding noted. Moderate edema noted to the surgical extremity.  Assessment: 1. s/p peroneal tendon repair of the left lower extremity. DOS: 05/08/17   Plan of Care:  1.  Patient was evaluated.  2.  Staples removed. 3.  Continue nonweightbearing in cam boot. 4.  Compression anklet dispensed. 5.  Return to clinic in 2 weeks.   Felecia ShellingBrent M. Lilja Soland, DPM [facility]  Dr. Felecia ShellingBrent M. Calen Posch, DPM    [address]                                        [place], [place] [address]                Office [phone](336) [phone]  Fax [phone](336) [phone]

## 2017-06-04 ENCOUNTER — Encounter: Payer: Self-pay | Admitting: Podiatry

## 2017-06-04 ENCOUNTER — Ambulatory Visit (INDEPENDENT_AMBULATORY_CARE_PROVIDER_SITE_OTHER): Payer: BLUE CROSS/BLUE SHIELD | Admitting: Podiatry

## 2017-06-04 DIAGNOSIS — Z9889 Other specified postprocedural states: Secondary | ICD-10-CM

## 2017-06-04 MED ORDER — OXYCODONE-ACETAMINOPHEN 5-325 MG PO TABS: 1.0000 | ORAL_TABLET | Freq: Three times a day (TID) | ORAL | 0 refills | Status: DC | PRN

## 2017-06-04 MED ORDER — ONDANSETRON HCL 4 MG PO TABS: 4.0000 mg | ORAL_TABLET | Freq: Three times a day (TID) | ORAL | 0 refills | Status: DC | PRN

## 2017-06-07 NOTE — Progress Notes (Signed)
   Subjective:  Patient presents today status post peroneal tendon repair of the left lower extremity. DOS: 05/08/17. She reports two small "bumps" near her incision site but denies any pain. She reports continued numbness to the incision site. Wearing the compression anklet helps alleviate the swelling. Patient is here for further evaluation and treatment.   Past Medical History:  Diagnosis Date  . Acne    accutane in 2000  . Back pain   . Bowel perforation (HCC)    at pelvic surgery   3 2012    . Endometriosis   . Neck pain   . Right lower lobe pneumonia (HCC) 11/07/2014   x ray finding       Objective/Physical Exam Skin incisions appear to be well coapted. No sign of infectious process noted. No dehiscence. No active bleeding noted. Moderate edema noted to the surgical extremity.  Assessment: 1. s/p peroneal tendon repair of the left lower extremity. DOS: 05/08/17   Plan of Care:  1. Patient was evaluated.  2. Begin weightbearing in CAM boot. 3. Orders for physical therapy placed. 4. Refill prescription for Percocet and Zofran provided to patient. 5. Continue wearing compression anklet. 6. Return to clinic in 2 weeks.   Felecia ShellingBrent M. Gabbriella Presswood, DPM [facility]  Dr. Felecia ShellingBrent M. Naila Elizondo, DPM    [address]                                        [place], [place] [address]                Office [phone](336) [phone]  Fax [phone](336) [phone]

## 2017-06-13 DIAGNOSIS — M25572 Pain in left ankle and joints of left foot: Secondary | ICD-10-CM | POA: Diagnosis not present

## 2017-06-13 DIAGNOSIS — M6281 Muscle weakness (generalized): Secondary | ICD-10-CM | POA: Diagnosis not present

## 2017-06-13 DIAGNOSIS — R269 Unspecified abnormalities of gait and mobility: Secondary | ICD-10-CM | POA: Diagnosis not present

## 2017-06-13 DIAGNOSIS — M7672 Peroneal tendinitis, left leg: Secondary | ICD-10-CM | POA: Diagnosis not present

## 2017-06-16 DIAGNOSIS — M7672 Peroneal tendinitis, left leg: Secondary | ICD-10-CM | POA: Diagnosis not present

## 2017-06-18 ENCOUNTER — Ambulatory Visit (INDEPENDENT_AMBULATORY_CARE_PROVIDER_SITE_OTHER): Payer: BLUE CROSS/BLUE SHIELD | Admitting: Podiatry

## 2017-06-18 DIAGNOSIS — M7672 Peroneal tendinitis, left leg: Secondary | ICD-10-CM | POA: Diagnosis not present

## 2017-06-18 DIAGNOSIS — Z9889 Other specified postprocedural states: Secondary | ICD-10-CM

## 2017-06-20 DIAGNOSIS — M7672 Peroneal tendinitis, left leg: Secondary | ICD-10-CM | POA: Diagnosis not present

## 2017-06-22 NOTE — Progress Notes (Signed)
   Subjective:  Patient presents today status post peroneal tendon repair of the left lower extremity. DOS: 05/08/17. She states she felt a "twinge" in the foot yesterday while moving boxes. She reports associated swelling of the foot that has since improved. She has been doing physical therapy as directed. She denies any new complaints at this time. Patient is here for further evaluation and treatment.   Past Medical History:  Diagnosis Date  . Acne    accutane in 2000  . Back pain   . Bowel perforation (HCC)    at pelvic surgery   3 2012    . Endometriosis   . Neck pain   . Right lower lobe pneumonia (HCC) 11/07/2014   x ray finding       Objective/Physical Exam Skin incisions appear to be well coapted. No sign of infectious process noted. No dehiscence. No active bleeding noted. Moderate edema noted to the surgical extremity.  Assessment: 1. s/p peroneal tendon repair of the left lower extremity. DOS: 05/08/17 - improved   Plan of Care:  1. Patient was evaluated.  2. Transition out of CAM boot into good sneakers. 3. Continue wearing compression anklet.  4. Continue with physical therapy.  5. Return to clinic in 4 weeks.   Felecia ShellingBrent M. Alex Leahy, DPM [facility]  Dr. Felecia ShellingBrent M. Beckey Polkowski, DPM    [address]                                        [place], [place] [address]                Office [phone]  Fax [phone]) [phone]

## 2017-06-23 DIAGNOSIS — M7672 Peroneal tendinitis, left leg: Secondary | ICD-10-CM | POA: Diagnosis not present

## 2017-06-25 DIAGNOSIS — M7672 Peroneal tendinitis, left leg: Secondary | ICD-10-CM | POA: Diagnosis not present

## 2017-06-27 DIAGNOSIS — M7672 Peroneal tendinitis, left leg: Secondary | ICD-10-CM | POA: Diagnosis not present

## 2017-06-30 DIAGNOSIS — M7672 Peroneal tendinitis, left leg: Secondary | ICD-10-CM | POA: Diagnosis not present

## 2017-07-02 DIAGNOSIS — M7672 Peroneal tendinitis, left leg: Secondary | ICD-10-CM | POA: Diagnosis not present

## 2017-07-04 DIAGNOSIS — M7672 Peroneal tendinitis, left leg: Secondary | ICD-10-CM | POA: Diagnosis not present

## 2017-07-09 DIAGNOSIS — M7672 Peroneal tendinitis, left leg: Secondary | ICD-10-CM | POA: Diagnosis not present

## 2017-07-11 DIAGNOSIS — M7672 Peroneal tendinitis, left leg: Secondary | ICD-10-CM | POA: Diagnosis not present

## 2017-07-14 ENCOUNTER — Ambulatory Visit: Payer: BLUE CROSS/BLUE SHIELD | Admitting: Internal Medicine

## 2017-07-14 ENCOUNTER — Encounter: Payer: Self-pay | Admitting: Internal Medicine

## 2017-07-14 VITALS — BP 118/82 | HR 73 | Temp 98.1°F | Wt 193.3 lb

## 2017-07-14 DIAGNOSIS — B9789 Other viral agents as the cause of diseases classified elsewhere: Secondary | ICD-10-CM

## 2017-07-14 DIAGNOSIS — J069 Acute upper respiratory infection, unspecified: Secondary | ICD-10-CM | POA: Diagnosis not present

## 2017-07-14 MED ORDER — HYDROCODONE-HOMATROPINE 5-1.5 MG/5ML PO SYRP: 5.0000 mL | ORAL_SOLUTION | Freq: Three times a day (TID) | ORAL | 0 refills | Status: DC | PRN

## 2017-07-14 NOTE — Patient Instructions (Addendum)
Exam is reassuring  But  Can try med at night   With caution and even add OTC chlorpheniramine .    For post nasal drainage.    Fu if  Fever  Relapsing sx etc
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
## 2017-07-15 DIAGNOSIS — M25572 Pain in left ankle and joints of left foot: Secondary | ICD-10-CM | POA: Diagnosis not present

## 2017-07-15 DIAGNOSIS — R269 Unspecified abnormalities of gait and mobility: Secondary | ICD-10-CM | POA: Diagnosis not present

## 2017-07-15 DIAGNOSIS — M7672 Peroneal tendinitis, left leg: Secondary | ICD-10-CM | POA: Diagnosis not present

## 2017-07-15 DIAGNOSIS — M6281 Muscle weakness (generalized): Secondary | ICD-10-CM | POA: Diagnosis not present

## 2017-07-16 ENCOUNTER — Ambulatory Visit (INDEPENDENT_AMBULATORY_CARE_PROVIDER_SITE_OTHER): Payer: BLUE CROSS/BLUE SHIELD | Admitting: Podiatry

## 2017-07-16 ENCOUNTER — Encounter: Payer: Self-pay | Admitting: Podiatry

## 2017-07-16 DIAGNOSIS — M25572 Pain in left ankle and joints of left foot: Secondary | ICD-10-CM | POA: Diagnosis not present

## 2017-07-16 DIAGNOSIS — R269 Unspecified abnormalities of gait and mobility: Secondary | ICD-10-CM | POA: Diagnosis not present

## 2017-07-16 DIAGNOSIS — Z9889 Other specified postprocedural states: Secondary | ICD-10-CM

## 2017-07-16 DIAGNOSIS — M7672 Peroneal tendinitis, left leg: Secondary | ICD-10-CM | POA: Diagnosis not present

## 2017-07-16 DIAGNOSIS — M6281 Muscle weakness (generalized): Secondary | ICD-10-CM | POA: Diagnosis not present

## 2017-07-19 NOTE — Progress Notes (Signed)
   Subjective:  Patient presents today status post peroneal tendon repair of the left lower extremity. DOS: 05/08/17.  She states she is doing well. She is currently still in physical therapy and has been wearing sneakers. She denies any new complaints at this time. Patient is here for further evaluation and treatment.   Past Medical History:  Diagnosis Date  . Acne    accutane in 2000  . Back pain   . Bowel perforation (HCC)    at pelvic surgery   3 2012    . Endometriosis   . Neck pain   . Right lower lobe pneumonia (HCC) 11/07/2014   x ray finding       Objective/Physical Exam Skin incisions appear to be well coapted. No sign of infectious process noted. No dehiscence. No active bleeding noted. Moderate edema noted to the surgical extremity.  Assessment: 1. s/p peroneal tendon repair of the left lower extremity. DOS: 05/08/17 - improved   Plan of Care:  1. Patient was evaluated.  2. May resume full activity with no restrictions.  3. Recommended good sneakers.  4. Continue physical therapy as needed.  5. Return to clinic as needed.   Going to a Pink concert.   Felecia ShellingBrent M. Evans, DPM [facility]  Dr. Felecia ShellingBrent M. Evans, DPM    [address]                                        [place], [place] [address]                Office [phone](336) [phone]  Fax [phone](336) [phone]

## 2017-07-24 DIAGNOSIS — M7672 Peroneal tendinitis, left leg: Secondary | ICD-10-CM | POA: Diagnosis not present

## 2017-07-24 DIAGNOSIS — M6281 Muscle weakness (generalized): Secondary | ICD-10-CM | POA: Diagnosis not present

## 2017-07-24 DIAGNOSIS — M25572 Pain in left ankle and joints of left foot: Secondary | ICD-10-CM | POA: Diagnosis not present

## 2017-07-24 DIAGNOSIS — R269 Unspecified abnormalities of gait and mobility: Secondary | ICD-10-CM | POA: Diagnosis not present

## 2017-07-25 ENCOUNTER — Ambulatory Visit: Payer: BLUE CROSS/BLUE SHIELD | Admitting: Orthotics

## 2017-07-25 DIAGNOSIS — S86312D Strain of muscle(s) and tendon(s) of peroneal muscle group at lower leg level, left leg, subsequent encounter: Secondary | ICD-10-CM

## 2017-07-25 DIAGNOSIS — Z9889 Other specified postprocedural states: Secondary | ICD-10-CM

## 2017-07-25 NOTE — Progress Notes (Signed)
Need to hug arec more and add scaphoid pad.

## 2017-07-30 DIAGNOSIS — M7672 Peroneal tendinitis, left leg: Secondary | ICD-10-CM | POA: Diagnosis not present

## 2017-07-30 DIAGNOSIS — M6281 Muscle weakness (generalized): Secondary | ICD-10-CM | POA: Diagnosis not present

## 2017-07-30 DIAGNOSIS — R269 Unspecified abnormalities of gait and mobility: Secondary | ICD-10-CM | POA: Diagnosis not present

## 2017-07-30 DIAGNOSIS — M25572 Pain in left ankle and joints of left foot: Secondary | ICD-10-CM | POA: Diagnosis not present

## 2017-08-05 DIAGNOSIS — Z6830 Body mass index (BMI) 30.0-30.9, adult: Secondary | ICD-10-CM | POA: Diagnosis not present

## 2017-08-05 DIAGNOSIS — Z01419 Encounter for gynecological examination (general) (routine) without abnormal findings: Secondary | ICD-10-CM | POA: Diagnosis not present

## 2017-08-05 DIAGNOSIS — Z1231 Encounter for screening mammogram for malignant neoplasm of breast: Secondary | ICD-10-CM | POA: Diagnosis not present

## 2017-08-21 ENCOUNTER — Ambulatory Visit: Payer: BLUE CROSS/BLUE SHIELD | Admitting: Orthotics

## 2017-08-21 DIAGNOSIS — M2580 Other specified joint disorders, unspecified joint: Secondary | ICD-10-CM

## 2017-08-21 NOTE — Progress Notes (Signed)
Picked up corrected f/o;  

## 2017-09-08 ENCOUNTER — Encounter: Payer: Self-pay | Admitting: Podiatry

## 2017-09-08 ENCOUNTER — Ambulatory Visit (INDEPENDENT_AMBULATORY_CARE_PROVIDER_SITE_OTHER): Payer: BLUE CROSS/BLUE SHIELD

## 2017-09-08 ENCOUNTER — Ambulatory Visit: Payer: BLUE CROSS/BLUE SHIELD | Admitting: Podiatry

## 2017-09-08 DIAGNOSIS — M659 Synovitis and tenosynovitis, unspecified: Secondary | ICD-10-CM

## 2017-09-08 DIAGNOSIS — M7672 Peroneal tendinitis, left leg: Secondary | ICD-10-CM | POA: Diagnosis not present

## 2017-09-08 DIAGNOSIS — S86312D Strain of muscle(s) and tendon(s) of peroneal muscle group at lower leg level, left leg, subsequent encounter: Secondary | ICD-10-CM

## 2017-09-09 NOTE — Progress Notes (Signed)
   Subjective:  Patient presents today status post peroneal tendon repair of the left lower extremity. DOS: 05/08/17. She reports pain to the lateral left ankle that radiates up the leg that began a few days ago. She reports associated cramping and swelling along the lateral left foot. She states she walks on the lateral sides of her feet which makes the pain worse. She has been wearing the orthotics but states they are no comfortable. She is unable to exercise due to the pain. Patient is here for further evaluation and treatment.   Past Medical History:  Diagnosis Date  . Acne    accutane in 2000  . Back pain   . Bowel perforation (HCC)    at pelvic surgery   3 2012    . Endometriosis   . Neck pain   . Right lower lobe pneumonia (HCC) 11/07/2014   x ray finding       Objective/Physical Exam Skin incisions appear to be well coapted. No sign of infectious process noted. No dehiscence. No active bleeding noted. Moderate edema noted to the surgical extremity. Pain with palpation to the lateral aspect of the left ankle. Pain with palpation to the peroneal tendon of the left foot.  Radiographic Exam:  Normal osseous mineralization. Joint spaces preserved. No fracture/dislocation/boney destruction.    Assessment: 1. s/p peroneal tendon repair of the left lower extremity. DOS: 05/08/17 - improved 2. Left ankle synovitis 3. Peroneal tendinitis left   Plan of Care:  1. Patient was evaluated. X-Rays reviewed.  2. Injection of 0.5 mLs Celestone Soluspan injected into the peroneal tendon sheath of the left foot.  3. Injection of 0.5 mLs Celestone Soluspan injected into the left ankle joint.  4. Ankle brace dispensed.  5. May need physical therapy initiated in 4 weeks.  6. Continue taking diclofenac.  7. Return to clinic in 4 weeks. If no improvement, an MRI will be ordered.    Felecia Shelling, DPM [facility]  Dr. Felecia Shelling, DPM    [address]                                         [place], [place] [address]                Office [phone]  Fax [phone]

## 2017-09-11 ENCOUNTER — Ambulatory Visit: Payer: BLUE CROSS/BLUE SHIELD | Admitting: Orthotics

## 2017-09-11 DIAGNOSIS — M7672 Peroneal tendinitis, left leg: Secondary | ICD-10-CM

## 2017-09-11 DIAGNOSIS — S86312D Strain of muscle(s) and tendon(s) of peroneal muscle group at lower leg level, left leg, subsequent encounter: Secondary | ICD-10-CM

## 2017-09-11 NOTE — Progress Notes (Signed)
Send F/O back to Richie to be redone: deeper heel, 4* valgus RF wedge wider base, Horsehoe pad in heel.

## 2017-09-25 ENCOUNTER — Ambulatory Visit: Payer: BLUE CROSS/BLUE SHIELD | Admitting: Orthotics

## 2017-09-25 DIAGNOSIS — S93602D Unspecified sprain of left foot, subsequent encounter: Secondary | ICD-10-CM

## 2017-09-25 DIAGNOSIS — S86312D Strain of muscle(s) and tendon(s) of peroneal muscle group at lower leg level, left leg, subsequent encounter: Secondary | ICD-10-CM

## 2017-09-25 DIAGNOSIS — M7672 Peroneal tendinitis, left leg: Secondary | ICD-10-CM

## 2017-09-25 NOTE — Progress Notes (Signed)
Need to remove scaphoid pad this time.

## 2017-10-13 ENCOUNTER — Encounter: Payer: Self-pay | Admitting: Podiatry

## 2017-10-13 ENCOUNTER — Ambulatory Visit: Payer: BLUE CROSS/BLUE SHIELD | Admitting: Orthotics

## 2017-10-13 ENCOUNTER — Ambulatory Visit: Payer: BLUE CROSS/BLUE SHIELD | Admitting: Podiatry

## 2017-10-13 DIAGNOSIS — M7672 Peroneal tendinitis, left leg: Secondary | ICD-10-CM

## 2017-10-13 DIAGNOSIS — S93602D Unspecified sprain of left foot, subsequent encounter: Secondary | ICD-10-CM

## 2017-10-13 DIAGNOSIS — S86312D Strain of muscle(s) and tendon(s) of peroneal muscle group at lower leg level, left leg, subsequent encounter: Secondary | ICD-10-CM

## 2017-10-13 NOTE — Progress Notes (Signed)
Patient picked up f/o w/ scaphoid pad removed; seemed pleased with outcome.

## 2017-10-15 NOTE — Progress Notes (Signed)
   HPI: 52 year old female presenting today for follow up evaluation of left foot pain. She states she is doing better and the swelling has improved. She reports a new complaint of cramping of the left calf that began two months ago. Stretching the leg helps alleviate the pain. Patient is here for further evaluation and treatment.   Past Medical History:  Diagnosis Date  . Acne    accutane in 2000  . Back pain   . Bowel perforation (HCC)    at pelvic surgery   3 2012    . Endometriosis   . Neck pain   . Right lower lobe pneumonia (HCC) 11/07/2014   x ray finding      Physical Exam: General: The patient is alert and oriented x3 in no acute distress.  Dermatology: Skin is warm, dry and supple bilateral lower extremities. Negative for open lesions or macerations.  Vascular: Palpable pedal pulses bilaterally. No edema or erythema noted. Capillary refill within normal limits.  Neurological: Epicritic and protective threshold grossly intact bilaterally.   Musculoskeletal Exam: Pain with palpation to the peroneal tendon of the left foot. Range of motion within normal limits to all pedal and ankle joints bilateral. Muscle strength 5/5 in all groups bilateral.   Assessment: 1. Peroneal tendinitis left - improving   Plan of Care:  1. Patient evaluated.  2. Begin increasing activity in good shoe gear and custom molded orthotics.  3. Night splint dispensed to help with nocturnal cramping.  4. Continue wearing ankle brace or compression anklets daily.  5. Return to clinic as needed.       Felecia ShellingBrent M. Evans, DPM [facility]  Dr. Felecia ShellingBrent M. Evans, DPM    [address]                                        [place], [place] [address]                Office [phone]  Fax [phone]

## 2017-11-03 DIAGNOSIS — M25512 Pain in left shoulder: Secondary | ICD-10-CM | POA: Diagnosis not present

## 2017-11-03 DIAGNOSIS — M7542 Impingement syndrome of left shoulder: Secondary | ICD-10-CM | POA: Diagnosis not present

## 2017-11-17 ENCOUNTER — Ambulatory Visit: Payer: BLUE CROSS/BLUE SHIELD | Admitting: Orthotics

## 2017-11-17 DIAGNOSIS — S86312D Strain of muscle(s) and tendon(s) of peroneal muscle group at lower leg level, left leg, subsequent encounter: Secondary | ICD-10-CM

## 2017-11-17 DIAGNOSIS — M659 Synovitis and tenosynovitis, unspecified: Secondary | ICD-10-CM

## 2017-11-17 NOTE — Progress Notes (Signed)
Adjusted f/o...added ff valgus wedge

## 2017-11-17 NOTE — Progress Notes (Signed)
Couldn't stay..dogs in car.

## 2017-12-22 ENCOUNTER — Ambulatory Visit: Payer: BLUE CROSS/BLUE SHIELD | Admitting: Orthotics

## 2017-12-22 DIAGNOSIS — M7672 Peroneal tendinitis, left leg: Secondary | ICD-10-CM

## 2017-12-22 DIAGNOSIS — S86312D Strain of muscle(s) and tendon(s) of peroneal muscle group at lower leg level, left leg, subsequent encounter: Secondary | ICD-10-CM

## 2017-12-22 DIAGNOSIS — M659 Synovitis and tenosynovitis, unspecified: Secondary | ICD-10-CM

## 2017-12-22 DIAGNOSIS — S93602D Unspecified sprain of left foot, subsequent encounter: Secondary | ICD-10-CM

## 2017-12-22 NOTE — Progress Notes (Signed)
Added valgus wedging forefoot.

## 2018-01-29 DIAGNOSIS — H5211 Myopia, right eye: Secondary | ICD-10-CM | POA: Diagnosis not present

## 2018-03-10 DIAGNOSIS — M7542 Impingement syndrome of left shoulder: Secondary | ICD-10-CM | POA: Diagnosis not present

## 2018-07-01 DIAGNOSIS — M25512 Pain in left shoulder: Secondary | ICD-10-CM | POA: Diagnosis not present

## 2018-07-01 DIAGNOSIS — M7542 Impingement syndrome of left shoulder: Secondary | ICD-10-CM | POA: Diagnosis not present

## 2018-08-20 DIAGNOSIS — M542 Cervicalgia: Secondary | ICD-10-CM | POA: Diagnosis not present

## 2018-09-03 DIAGNOSIS — M542 Cervicalgia: Secondary | ICD-10-CM | POA: Diagnosis not present

## 2018-09-08 DIAGNOSIS — Z01419 Encounter for gynecological examination (general) (routine) without abnormal findings: Secondary | ICD-10-CM | POA: Diagnosis not present

## 2018-09-08 DIAGNOSIS — Z6828 Body mass index (BMI) 28.0-28.9, adult: Secondary | ICD-10-CM | POA: Diagnosis not present

## 2018-09-08 DIAGNOSIS — Z1231 Encounter for screening mammogram for malignant neoplasm of breast: Secondary | ICD-10-CM | POA: Diagnosis not present

## 2018-09-15 DIAGNOSIS — M542 Cervicalgia: Secondary | ICD-10-CM | POA: Diagnosis not present

## 2018-09-16 DIAGNOSIS — M5000 Cervical disc disorder with myelopathy, unspecified cervical region: Secondary | ICD-10-CM | POA: Diagnosis not present

## 2018-09-17 DIAGNOSIS — M542 Cervicalgia: Secondary | ICD-10-CM | POA: Diagnosis not present

## 2018-09-18 DIAGNOSIS — M5412 Radiculopathy, cervical region: Secondary | ICD-10-CM | POA: Diagnosis not present

## 2018-09-30 DIAGNOSIS — Z1159 Encounter for screening for other viral diseases: Secondary | ICD-10-CM | POA: Diagnosis not present

## 2018-10-06 DIAGNOSIS — M5013 Cervical disc disorder with radiculopathy, cervicothoracic region: Secondary | ICD-10-CM | POA: Diagnosis not present

## 2018-10-06 DIAGNOSIS — M5023 Other cervical disc displacement, cervicothoracic region: Secondary | ICD-10-CM | POA: Diagnosis not present

## 2018-10-06 DIAGNOSIS — M5020 Other cervical disc displacement, unspecified cervical region: Secondary | ICD-10-CM | POA: Diagnosis not present

## 2018-10-06 DIAGNOSIS — M5412 Radiculopathy, cervical region: Secondary | ICD-10-CM | POA: Diagnosis not present

## 2018-10-12 ENCOUNTER — Telehealth (HOSPITAL_COMMUNITY): Payer: Self-pay | Admitting: Rehabilitation

## 2018-10-12 ENCOUNTER — Telehealth: Payer: Self-pay | Admitting: *Deleted

## 2018-10-12 ENCOUNTER — Other Ambulatory Visit: Payer: Self-pay

## 2018-10-12 ENCOUNTER — Other Ambulatory Visit (HOSPITAL_COMMUNITY): Payer: Self-pay | Admitting: Neurological Surgery

## 2018-10-12 ENCOUNTER — Ambulatory Visit (HOSPITAL_COMMUNITY)
Admission: RE | Admit: 2018-10-12 | Discharge: 2018-10-12 | Disposition: A | Discharge status: Home / Self Care | Payer: BLUE CROSS/BLUE SHIELD | Source: Ambulatory Visit | Attending: Family | Admitting: Family

## 2018-10-12 DIAGNOSIS — M7989 Other specified soft tissue disorders: Secondary | ICD-10-CM | POA: Diagnosis not present

## 2018-10-12 NOTE — Telephone Encounter (Signed)
Copied from CRM [id number]. Topic: General - Inquiry >> Oct 12, 2018 11:42 AM Crist Infante wrote: Reason for CRM: Deresha from Dr Ilean Skill office called to let Dr Elliot Gurney know pt positive for DVT in peroneal vein.

## 2018-10-12 NOTE — Telephone Encounter (Signed)

## 2018-10-12 NOTE — Telephone Encounter (Signed)
Called  Dr. Ilean Skill office and per Thereasa Parkin Dr. Ilean Skill is calling patient in some  [place]

## 2018-10-12 NOTE — Telephone Encounter (Signed)
Please find out if Dr. Danielle Dess wants to treat the DVT or if he wants [place] to? If we need to treat it, I would want an OV with her(either live or Doxy is okay)

## 2018-10-13 NOTE — Telephone Encounter (Signed)
Excellent

## 2018-10-21 DIAGNOSIS — M5000 Cervical disc disorder with myelopathy, unspecified cervical region: Secondary | ICD-10-CM | POA: Diagnosis not present

## 2018-11-04 ENCOUNTER — Other Ambulatory Visit (HOSPITAL_COMMUNITY): Payer: Self-pay

## 2018-11-04 DIAGNOSIS — R1310 Dysphagia, unspecified: Secondary | ICD-10-CM

## 2018-11-10 ENCOUNTER — Other Ambulatory Visit: Payer: Self-pay

## 2018-11-10 ENCOUNTER — Ambulatory Visit (HOSPITAL_COMMUNITY)
Admission: RE | Admit: 2018-11-10 | Discharge: 2018-11-10 | Disposition: A | Discharge status: Home / Self Care | Payer: BC Managed Care – PPO | Source: Ambulatory Visit | Attending: Neurological Surgery | Admitting: Neurological Surgery

## 2018-11-10 DIAGNOSIS — R1310 Dysphagia, unspecified: Secondary | ICD-10-CM | POA: Diagnosis not present

## 2018-11-10 NOTE — Progress Notes (Signed)
Objective Swallowing Evaluation: Type of Study: MBS-Modified Barium Swallow Study   Patient Details  Name: [name] [name] MRN: [id number] Date of Birth: 03/06/1966  Today's Date: 11/10/2018 Time: SLP Start Time (ACUTE ONLY): 1153 -SLP Stop Time (ACUTE ONLY): 1215  SLP Time Calculation (min) (ACUTE ONLY): 22 min   Past Medical History:  Past Medical History:  Diagnosis Date  . Acne    accutane in 2000  . Back pain   . Bowel perforation (HCC)    at pelvic surgery   3 2012    . Endometriosis   . Neck pain   . Right lower lobe pneumonia (HCC) 11/07/2014   x ray finding    Past Surgical History:  Past Surgical History:  Procedure Laterality Date  . ABDOMINAL HYSTERECTOMY    . EXPLORATORY LAPAROTOMY    . left ovary removed    . LITHOTRIPSY    . right ovary removed  08/10/10   complication bowel perforation   HPI: Pt seen for outpatient MBS with history of ACDF s/p 5 weeks, hiatal hernia, GERD. Pt reports a pharyngeal globus sensation, frequent throat clearing with onset following ACDF. She reports increased throat burning. She is currently taling Prilosec 20 mg. Intake is decreased and having to "force myself to eat."     No data recorded   Assessment / Plan / Recommendation  CHL IP CLINICAL IMPRESSIONS 11/10/2018  Clinical Impression Pt's oropharyngeal swallow function was within normal limits. Motor and sensory function with swallow initiation timely, hyolaryngeal excursion and epiglottic inversion were efficient. MBS does not diagnose below the UES however esophageal scan revealed more distal stasis than this would be expected with mild retrograde movement. Pt has noteable dysphonia vocal quality and dyspnea after entering the radiology suite. She reports she is frequently short of breath and voice is hoarse since surgery and intensity decreases as day progresses. Is dysphonia related to ACDF or possibly GER (?). She may benefit from ENT consult and outpatient ST for voice if  warranted. SLP discussed this with pt. Recommend continue regular texture, thin liquids and educated re: reflux precautions verbally and handout.   SLP Visit Diagnosis Dysphagia, unspecified (R13.10)  Attention and concentration deficit following --  Frontal lobe and executive function deficit following --  Impact on safety and function Mild aspiration risk      CHL IP TREATMENT RECOMMENDATION 11/10/2018  Treatment Recommendations No treatment recommended at this time     No flowsheet data found.  CHL IP DIET RECOMMENDATION 11/10/2018  SLP Diet Recommendations Regular solids;Thin liquid  Liquid Administration via Cup;Straw  Medication Administration Whole meds with liquid  Compensations --  Postural Changes Remain semi-upright after after feeds/meals (Comment);Seated upright at 90 degrees      CHL IP OTHER RECOMMENDATIONS 11/10/2018  Recommended Consults Consider ENT evaluation  Oral Care Recommendations Oral care BID  Other Recommendations --      CHL IP FOLLOW UP RECOMMENDATIONS 11/10/2018  Follow up Recommendations None      No flowsheet data found.         CHL IP ORAL PHASE 11/10/2018  Oral Phase WFL  Oral - Pudding Teaspoon --  Oral - Pudding Cup --  Oral - Honey Teaspoon --  Oral - Honey Cup --  Oral - Nectar Teaspoon --  Oral - Nectar Cup --  Oral - Nectar Straw --  Oral - Thin Teaspoon --  Oral - Thin Cup --  Oral - Thin Straw --  Oral - Puree --  Oral -  Mech Soft --  Oral - Regular --  Oral - Multi-Consistency --  Oral - Pill --  Oral Phase - Comment --    CHL IP PHARYNGEAL PHASE 11/10/2018  Pharyngeal Phase WFL  Pharyngeal- Pudding Teaspoon --  Pharyngeal --  Pharyngeal- Pudding Cup --  Pharyngeal --  Pharyngeal- Honey Teaspoon --  Pharyngeal --  Pharyngeal- Honey Cup --  Pharyngeal --  Pharyngeal- Nectar Teaspoon --  Pharyngeal --  Pharyngeal- Nectar Cup --  Pharyngeal --  Pharyngeal- Nectar Straw --  Pharyngeal --  Pharyngeal- Thin Teaspoon  --  Pharyngeal --  Pharyngeal- Thin Cup --  Pharyngeal --  Pharyngeal- Thin Straw --  Pharyngeal --  Pharyngeal- Puree --  Pharyngeal --  Pharyngeal- Mechanical Soft --  Pharyngeal --  Pharyngeal- Regular --  Pharyngeal --  Pharyngeal- Multi-consistency --  Pharyngeal --  Pharyngeal- Pill --  Pharyngeal --  Pharyngeal Comment --     CHL IP CERVICAL ESOPHAGEAL PHASE 11/10/2018  Cervical Esophageal Phase WFL  Pudding Teaspoon --  Pudding Cup --  Honey Teaspoon --  Honey Cup --  Nectar Teaspoon --  Nectar Cup --  Nectar Straw --  Thin Teaspoon --  Thin Cup --  Thin Straw --  Puree --  Mechanical Soft --  Regular --  Multi-consistency --  Pill --  Cervical Esophageal Comment --     [name] [name] 11/10/2018, 2:01 PM       [name] [name] M.Ed Risk analyst [phone] Office [phone]

## 2018-11-26 DIAGNOSIS — M7542 Impingement syndrome of left shoulder: Secondary | ICD-10-CM | POA: Diagnosis not present

## 2018-11-26 DIAGNOSIS — M25512 Pain in left shoulder: Secondary | ICD-10-CM | POA: Diagnosis not present

## 2018-11-27 DIAGNOSIS — J3801 Paralysis of vocal cords and larynx, unilateral: Secondary | ICD-10-CM | POA: Insufficient documentation

## 2018-11-27 DIAGNOSIS — J3489 Other specified disorders of nose and nasal sinuses: Secondary | ICD-10-CM | POA: Diagnosis not present

## 2018-11-27 DIAGNOSIS — R1314 Dysphagia, pharyngoesophageal phase: Secondary | ICD-10-CM | POA: Diagnosis not present

## 2018-12-05 DIAGNOSIS — M25512 Pain in left shoulder: Secondary | ICD-10-CM | POA: Diagnosis not present

## 2018-12-21 DIAGNOSIS — M19012 Primary osteoarthritis, left shoulder: Secondary | ICD-10-CM | POA: Insufficient documentation

## 2018-12-21 DIAGNOSIS — M7542 Impingement syndrome of left shoulder: Secondary | ICD-10-CM | POA: Diagnosis not present

## 2018-12-23 DIAGNOSIS — M5000 Cervical disc disorder with myelopathy, unspecified cervical region: Secondary | ICD-10-CM | POA: Diagnosis not present

## 2018-12-28 DIAGNOSIS — M19012 Primary osteoarthritis, left shoulder: Secondary | ICD-10-CM | POA: Diagnosis not present

## 2018-12-28 DIAGNOSIS — M75122 Complete rotator cuff tear or rupture of left shoulder, not specified as traumatic: Secondary | ICD-10-CM | POA: Diagnosis not present

## 2018-12-28 DIAGNOSIS — M7521 Bicipital tendinitis, right shoulder: Secondary | ICD-10-CM | POA: Diagnosis not present

## 2019-01-01 ENCOUNTER — Ambulatory Visit: Payer: BC Managed Care – PPO | Admitting: Internal Medicine
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
## 2019-01-05 ENCOUNTER — Encounter: Payer: Self-pay | Admitting: Internal Medicine

## 2019-01-05 ENCOUNTER — Ambulatory Visit: Payer: BC Managed Care – PPO | Admitting: Internal Medicine

## 2019-01-05 VITALS — BP 126/82 | HR 66 | Temp 98.7°F | Wt 180.6 lb

## 2019-01-05 DIAGNOSIS — Z7901 Long term (current) use of anticoagulants: Secondary | ICD-10-CM | POA: Diagnosis not present

## 2019-01-05 DIAGNOSIS — M25512 Pain in left shoulder: Secondary | ICD-10-CM

## 2019-01-05 DIAGNOSIS — E8940 Asymptomatic postprocedural ovarian failure: Secondary | ICD-10-CM

## 2019-01-05 DIAGNOSIS — G8929 Other chronic pain: Secondary | ICD-10-CM

## 2019-01-05 DIAGNOSIS — Z01818 Encounter for other preprocedural examination: Secondary | ICD-10-CM

## 2019-01-05 DIAGNOSIS — I82451 Acute embolism and thrombosis of right peroneal vein: Secondary | ICD-10-CM

## 2019-01-05 DIAGNOSIS — Z79890 Hormone replacement therapy (postmenopausal): Secondary | ICD-10-CM

## 2019-01-05 LAB — CBC WITH DIFFERENTIAL/PLATELET
Basophils Absolute: 0 10*3/uL (ref 0.0–0.1)
Basophils Relative: 0.6 % (ref 0.0–3.0)
Eosinophils Absolute: 0.1 10*3/uL (ref 0.0–0.7)
Eosinophils Relative: 1.7 % (ref 0.0–5.0)
HCT: 41.2 % (ref 36.0–46.0)
Hemoglobin: 13.9 g/dL (ref 12.0–15.0)
Lymphocytes Relative: 36.2 % (ref 12.0–46.0)
Lymphs Abs: 2.3 10*3/uL (ref 0.7–4.0)
MCHC: 33.8 g/dL (ref 30.0–36.0)
MCV: 95.9 fl (ref 78.0–100.0)
Monocytes Absolute: 0.4 10*3/uL (ref 0.1–1.0)
Monocytes Relative: 6.6 % (ref 3.0–12.0)
Neutro Abs: 3.4 10*3/uL (ref 1.4–7.7)
Neutrophils Relative %: 54.9 % (ref 43.0–77.0)
Platelets: 237 10*3/uL (ref 150.0–400.0)
RBC: 4.3 Mil/uL (ref 3.87–5.11)
RDW: 12.5 % (ref 11.5–15.5)
WBC: 6.3 10*3/uL (ref 4.0–10.5)

## 2019-01-05 LAB — COMPREHENSIVE METABOLIC PANEL
ALT: 33 U/L (ref 0–35)
AST: 25 U/L (ref 0–37)
Albumin: 4.8 g/dL (ref 3.5–5.2)
Alkaline Phosphatase: 60 U/L (ref 39–117)
BUN: 20 mg/dL (ref 6–23)
CO2: 29 mEq/L (ref 19–32)
Calcium: 9.4 mg/dL (ref 8.4–10.5)
Chloride: 102 mEq/L (ref 96–112)
Creatinine, Ser: 0.83 mg/dL (ref 0.40–1.20)
GFR: 71.67 mL/min (ref >60.00)
Glucose, Bld: 92 mg/dL (ref 70–99)
Potassium: 4.1 mEq/L (ref 3.5–5.1)
Sodium: 139 mEq/L (ref 135–145)
Total Bilirubin: 0.4 mg/dL (ref 0.2–1.2)
Total Protein: 7.3 g/dL (ref 6.0–8.3)

## 2019-01-05 NOTE — Patient Instructions (Signed)
Will send over  Clearance for surgery. If possible would  Be off the estrogen around the time of surgery to decrease the risk of  DVT.   Lab today  And will let you know  Results .

## 2019-01-06 NOTE — Telephone Encounter (Signed)
Thanks for the input .   We dont usually do a hypercoagulable panel   And factor V lieden mutation  and eval  With your situation .  If  you have recurrent problem then would consider  More  evaluation . The longer you are on the blood thinner the risk goes down for recurrent clotting.  Below is  Copy of  Advice from [place] resource .   Patients who do not benefit-With the exceptions listed in the above sections (see 'Patients who may benefit' above), most patients who present with VTE do not benefit from testing for hypercoagulable disorders. This is because in most patients with an initial episode of VTE, the identification of an inheritable defect does not alter therapeutic or prophylactic anticoagulant management nor has it been shown to reduce mortality or VTE recurrence [12-14]. Despite an increased likelihood of having a hypercoagulable disorder, patients with a first episode of unprovoked VTE and those with an upper extremity VTE do not benefit from testing. Data that support the avoidance of testing in specific populations in these patient populations are discussed in the sections below. (See 'First episode unprovoked VTE' below and 'Upper extremity thrombosis' below.) Additional examples of patients that do not need testing include patients with: ?A first episode provoked VTE (see "Rationale and indications for indefinite anticoagulation in patients with venous thromboembolism", section on 'Provoked VTE with transient risk factor')

## 2019-02-02 DIAGNOSIS — J3801 Paralysis of vocal cords and larynx, unilateral: Secondary | ICD-10-CM | POA: Diagnosis not present

## 2019-02-03 DIAGNOSIS — H5211 Myopia, right eye: Secondary | ICD-10-CM | POA: Diagnosis not present

## 2019-03-22 ENCOUNTER — Ambulatory Visit: Payer: BC Managed Care – PPO | Attending: Otolaryngology | Admitting: Speech Pathology

## 2019-03-22 ENCOUNTER — Encounter: Payer: Self-pay | Admitting: Speech Pathology

## 2019-03-22 ENCOUNTER — Other Ambulatory Visit: Payer: Self-pay

## 2019-03-22 DIAGNOSIS — R498 Other voice and resonance disorders: Secondary | ICD-10-CM | POA: Diagnosis not present

## 2019-03-22 NOTE — Patient Instructions (Signed)
   Semi-occluded vocal tract exercises (SOVTE)  These allow your vocal folds to vibrate without excess tension and promotes high placement of the voice  Use SOVTE as a warm up before prolonged speaking and vocal exercises   High resistance: voicing through a stirring straw  Medium resistance: voicing through a drinking straw  Less resistance: Voiced /v/                            Lip or Tongue Trill                            Nasal "hums" /m/ and /n/                            Vowels /u/ and ee  Watch Vocal Straw Exercises with Lolita Cram on YouTube:  [email or web address].be  Pitch Glides for 2 minutes  Accents (siren)  Hum the [employer]  A goal would be 2-3 minutes several times a day and prior to vocal exercises  As always, use good belly breathing while completing SOVTE  Before work, during breaks, after work, before you go out  Take big belly breaths if you have to project   Yes, you are running out of air more frequently - take more frequent breaths rather than strain from your throat to get your sentences   Eliminate throat clears - forceful breath, then swallow hard; sip liquid then do a hard swallow; if you must, a gentle throat clear  Have you s/o let you know when you clear your throat  Limit the background noise of TV, appliances music when you are conversing at home  Be in close proximity and looking at the person you are talking to   When you feel strain, or fast talking or frog voice, it means take a breath   Breathe breathe breathe to talk

## 2019-03-22 NOTE — Therapy (Signed)
[facility] [address] [place], [place], [address] Phone: [phone]   Fax:  [phone]  Speech Language Pathology Evaluation  Patient Details  Name: [name] [name] MRN: [id number] Date of Birth: January 10, 1966 Referring Provider (SLP): Dr. Avel Peace   Encounter Date: 03/22/2019  End of Session - 03/22/19 1538    Visit Number  1    Number of Visits  1    Date for SLP Re-Evaluation  03/22/19    SLP Start Time  1233    SLP Stop Time   1314    SLP Time Calculation (min)  41 min    Activity Tolerance  Patient tolerated treatment well       Past Medical History:  Diagnosis Date  . Acne    accutane in 2000  . Back pain   . Bowel perforation ([place])    at pelvic surgery   3 2012    . Endometriosis   . Neck pain   . Right lower lobe pneumonia 11/07/2014   x ray finding     Past Surgical History:  Procedure Laterality Date  . ABDOMINAL HYSTERECTOMY    . EXPLORATORY LAPAROTOMY    . left ovary removed    . LITHOTRIPSY    . right ovary removed  3/87/56   complication bowel perforation    There were no vitals filed for this visit.  Subjective Assessment - 03/22/19 1529    Subjective  "It comes and goes" re: voice         SLP Evaluation [facility] - 03/22/19 1237      SLP Visit Information   SLP Received On  03/22/19    Referring Provider (SLP)  Dr. Avel Peace    Onset Date  10/06/18    Medical Diagnosis  left vocal cord paralysis      Subjective   Patient/Family Stated Goal  To have my voice all day      General Information   HPI  Ms. Kimbley is s/p left incision ACDF 09/2018 with post op dysphonia/aphonia. ENT consult revealed left vocal fold paralysis in 11/2018. Pt returned to ENT for f/u in 01/2019 with little change in dysphonia and elected to participate in [place] prior to possible larynoplasty. Since her last visit to ENT, Ms. Stlouis reports her voice has improved considerably. Coughing with PO has  resolved as long as she take small thin liqiud sips.     Mobility Status  walks independently      Balance Screen   Has the patient fallen in the past 6 months  No    Has the patient had a decrease in activity level because of a fear of falling?   No    Is the patient reluctant to leave their home because of a fear of falling?   No      Prior Functional Status   Cognitive/Linguistic Baseline  Within functional limits    Type of Home  House     Lives With  --   significant other   Available Support  Family    Vocation  Full time employment      Cognition   Overall Cognitive Status  Within Functional Limits for tasks assessed      Oral Motor/Sensory Function   Overall Oral Motor/Sensory Function  Appears within functional limits for tasks assessed      Motor Speech   Phonation  Breathy;Hoarse    Resonance  Within functional limits    Intelligibility  Intelligible    Phonation  Impaired    Vocal Abuses  Prolonged Vocal Use   vocal fold paralysis   Tension Present  --   none   Volume  Appropriate;Decibel Level   67dB to 69dB   Pitch  Appropriate   176 Hz to 220Hz                   ADULT SLP TREATMENT - 03/22/19 1238      General Information   Behavior/Cognition  Alert;Cooperative;Pleasant mood        SLP Education - 03/22/19 1537    Education Details  SOVTE; breath more frequently; when you feel strain, breathe, vocal hygiene; voice conservation    Person(s) Educated  Patient    Methods  Explanation;Demonstration;Verbal cues;Handout    Comprehension  Verbalized understanding;Returned demonstration           Plan - 03/22/19 1538    Clinical Impression Statement  Ms. Traynham presents today with mild dysphonia with intermittent breathiness and dipliphonia. She reports significan improvement in her voice since her last ENT visit in September. Ms. Pecola LeisureManess states that family and friends have commented on her improved voice.  Sustained /a/ was 18.3 seconds -  WNL; s:z ratio is .9956 also WNL, not indicative of vocal fold pathology. Volume in conversation and oral reading was 67-69dB - low WNL, but appropriate for this quiet room. Her pitch range in conversation and reading was 174.6 to 261.6 Hz, WNL.Ms. Pecola LeisureManess completed the Voice Related Quality of Life (V-RQOL) survey with score WNL. She does run out of breath when she talks and is able to explain why this happens. Pharyngeal or clavicular tension not observed. Pt did clear her throat 2x during the assesment. We discussed avoiding laryngeal strain, and using frequent abdominal breath support. Ms. 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 She will continue on reflux meds. I instructed pt on semi-occluded vocal tract exercises (SOVTE) to warm up or reset her voice throughout the day.  As Ms. Serrao' voice has improved significantly and she is having shoulder surgery next week, we agree to defer skilled ST at this time. Ongoing improvement in her voice is expected. Pt educated in voice conservation and hygiene.    Speech Therapy Frequency  One time visit    Treatment/Interventions  Internal/external aids;Compensatory techniques;Patient/family education;Compensatory strategies    Potential to Achieve Goals  Good    Consulted and Agree with Plan of Care  Patient       Patient will benefit from skilled therapeutic intervention in order to improve the following deficits and impairments:   Other voice and resonance disorders    Problem List Patient Active Problem List   Diagnosis Date Noted  . Essential hypertension 12/02/2014  . Elevated BP 11/07/2014  . Acute sinusitis with symptoms greater than 10 days 11/07/2014  . Visit for preventive health examination 03/21/2011  . Premature surgical menopause on HRT 03/21/2011  . Unspecified tinnitus 03/21/2011  . Elevated blood pressure reading 03/20/2011  .  CONSTIPATION 01/13/2009  . NAUSEA 01/13/2009  . DYSPHAGIA 01/13/2009  . ABDOMINAL BLOATING 01/13/2009  . RENAL CALCULUS, HX OF 01/13/2009  . Esophageal reflux 12/30/2008  . ENDOMETRIOSIS 12/30/2008  . DYSPHAGIA UNSPECIFIED 12/30/2008    Ahmet Schank, Radene JourneyLaura Ann MS, CCC-SLP 03/22/2019, 3:54 PM  [facility] [address] [place], [place], [address] Phone: [phone]   Fax:  [phone]  Name: RAYMONA BOSS MRN: [id number] Date of Birth: 15-Jun-1965

## 2019-03-23 IMAGING — MR MR FOOT*L* W/O CM
5 series · 37 of 40 positions shown · non-contrast
Comparison: Radiographs 03/10/2017

CLINICAL DATA: Left ankle and foot pain and swelling since October 2015.

EXAM:
MRI OF THE LEFT FOOT WITHOUT CONTRAST
TECHNIQUE: Multiplanar, multisequence MR imaging of the ankle was performed. No
intravenous contrast was administered.

[Series 5: PD fat-sat · axial · 3.0mm · 0.44mm/px · z∈[-78,+43]mm · 8 of 32 slices shown]
[im 1/32]
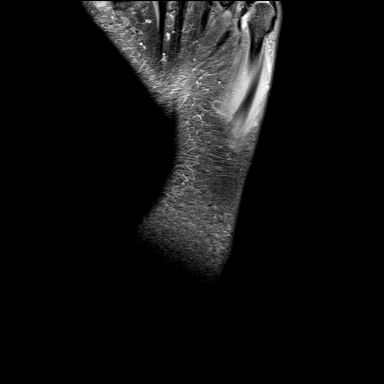
[im 4/32]
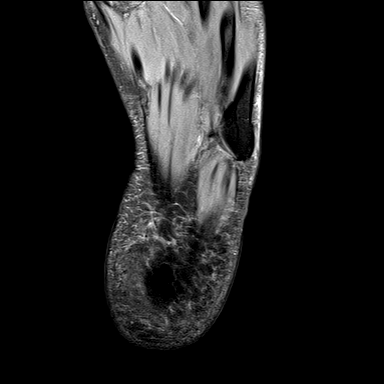
[im 11/32]
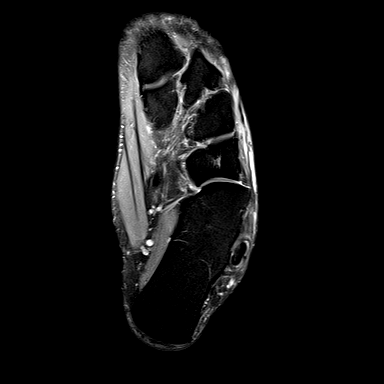
[im 14/32]
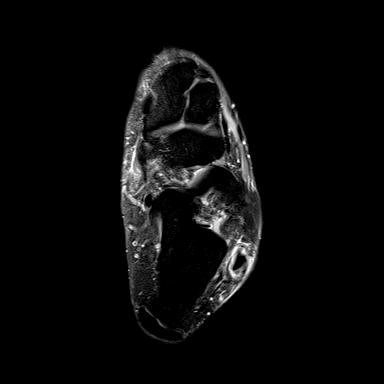
[im 18/32]
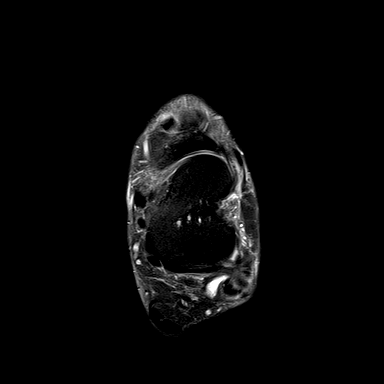
[im 21/32]
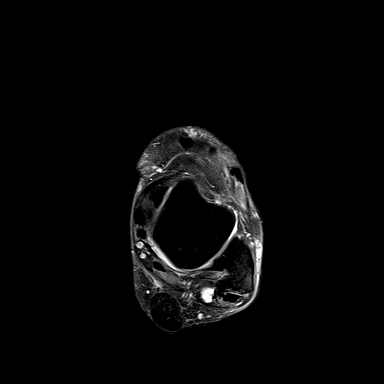
[im 28/32]
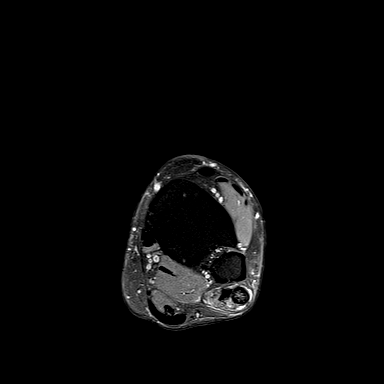
[im 32/32]
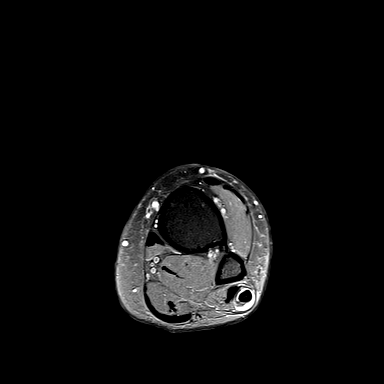

[Series 6: T2 fat-sat · axial · 3.0mm · 0.53mm/px · z∈[-78,+43]mm · 9 of 32 slices shown (1 of 3)]
[im 1/32]
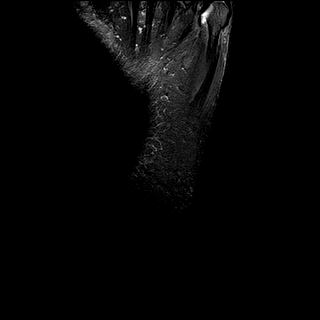
[im 4/32]
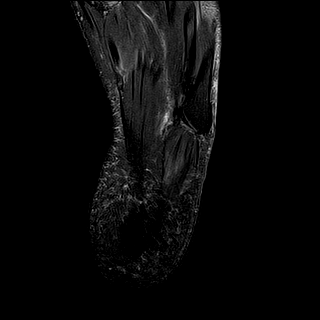
[im 8/32]
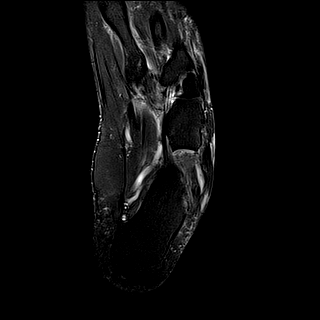
[im 12/32]
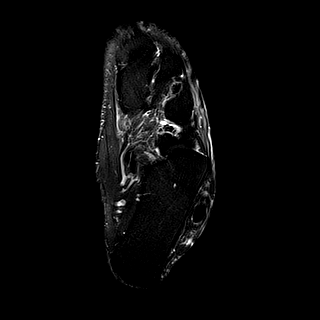
[im 16/32]
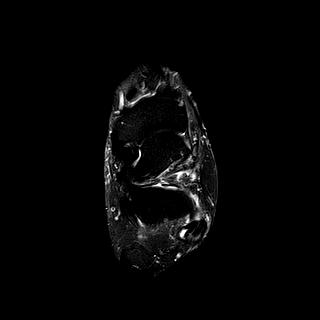
[im 20/32]
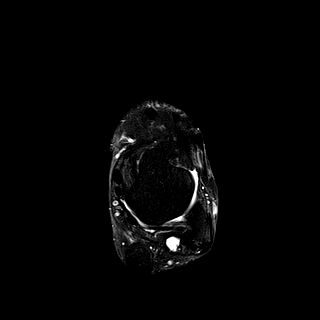
[im 24/32]
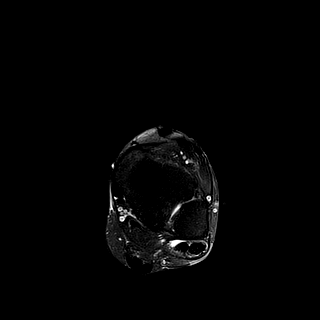
[im 28/32]
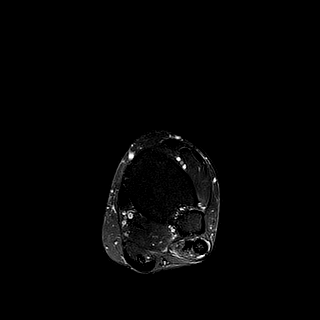
[im 32/32]
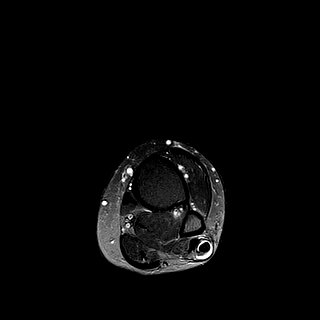

[Series 7: T1 · sagittal · 3.0mm · 0.40mm/px · 5 of 23 slices shown]
[im 1/23]
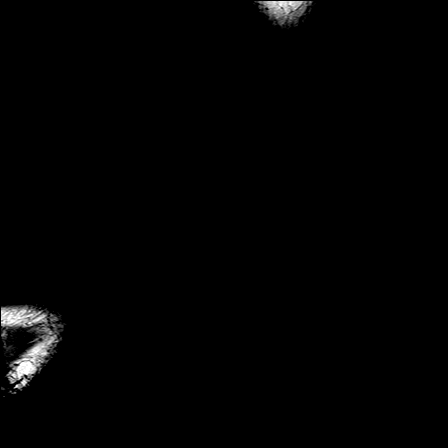
[im 5/23]
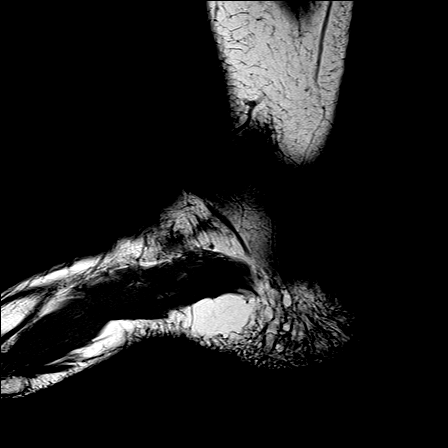
[im 9/23]
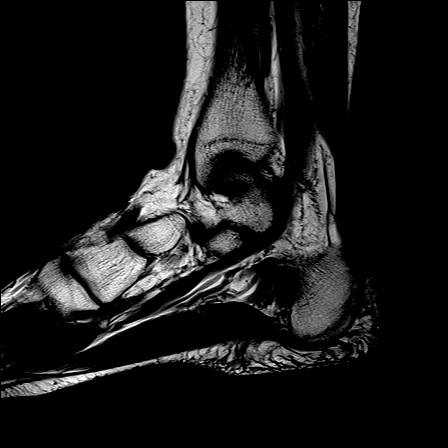
[im 14/23]
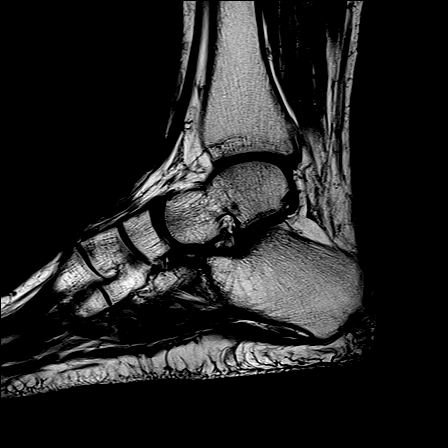
[im 18/23]
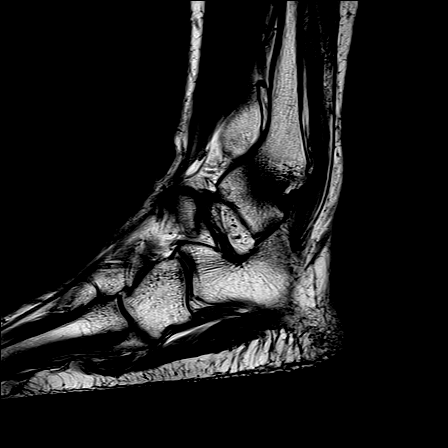

[Series 8: T2 fat-sat · sagittal · 3.0mm · 0.56mm/px · 6 of 23 slices shown (2 of 3)]
[im 1/23]
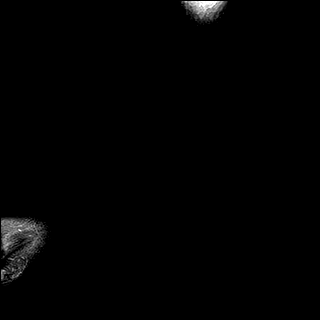
[im 5/23]
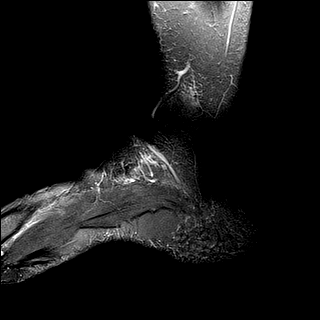
[im 9/23]
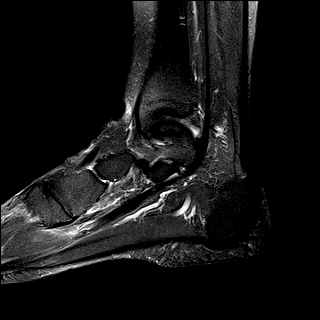
[im 14/23]
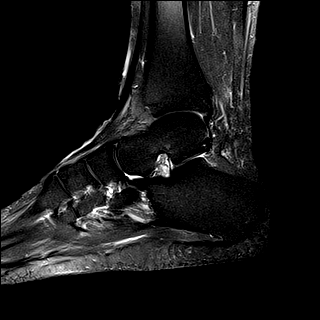
[im 18/23]
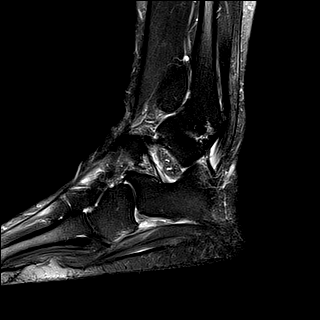
[im 23/23]
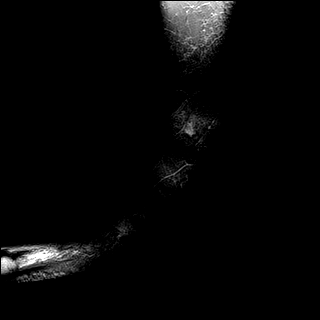

[Series 9: T2 fat-sat · coronal · 3.5mm · 0.47mm/px · 9 of 32 slices shown (3 of 3)]
[im 1/32]
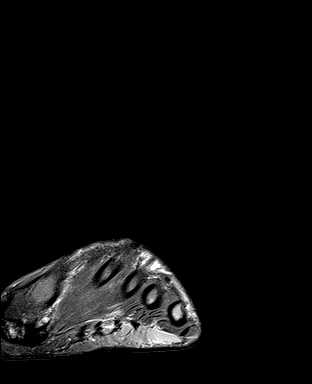
[im 4/32]
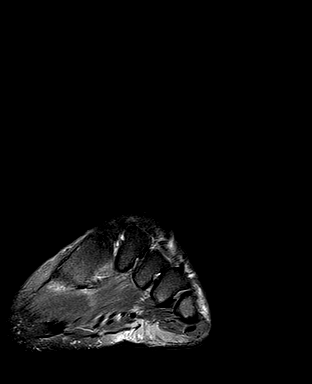
[im 8/32]
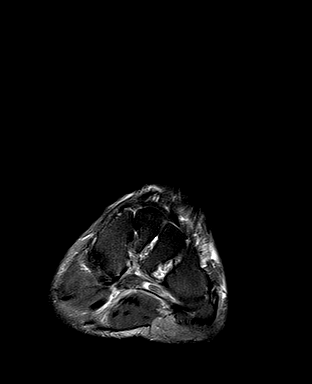
[im 12/32]
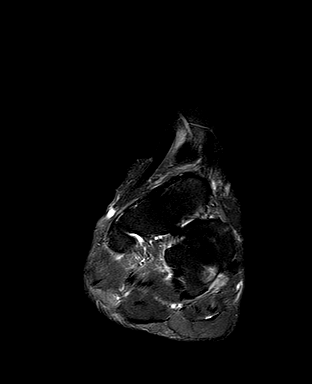
[im 16/32]
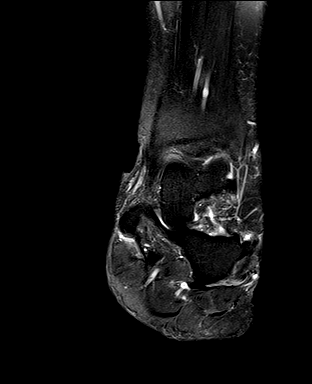
[im 20/32]
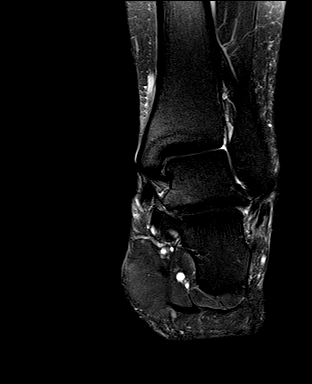
[im 24/32]
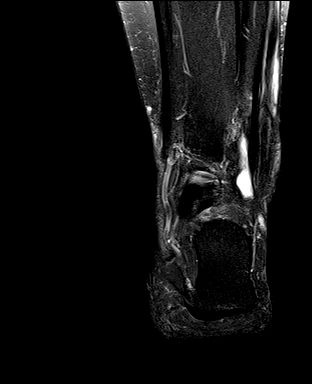
[im 28/32]
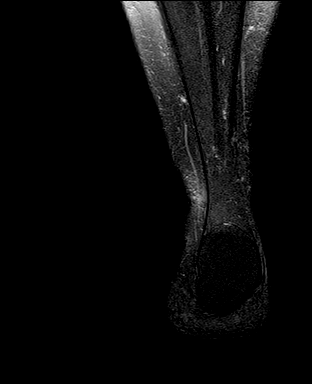
[im 32/32]
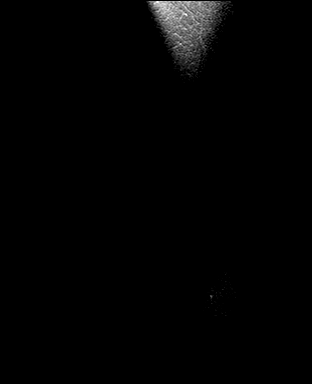

[37 of 40 positions shown; findings below may reference images not displayed]

FINDINGS: TENDONS

Peroneal: Severe tendinopathy and tenosynovitis involving the
peroneal tendons.

The peroneus brevis tendon is thickened and demonstrates
interstitial tears and longitudinal split type tears but it is
intact to its attachment the base of the fifth metatarsal.

The peroneus longus tendon is markedly thickened and demonstrates
interstitial tears proximally. High-grade partial tear in the mid
substance area as it courses under the calcaneus. It is also
completely torn distally just proximal to its attachment site at the
base of the first metatarsal and medial cuneiform.

Posteromedial: Intact

Anterior: Intact

Achilles: Intact.  Minimal tendinopathy.

Plantar Fascia: For intact

LIGAMENTS

Lateral: Intact

Medial: Intact

CARTILAGE

Ankle Joint: No significant degenerative changes, chondral defect or
osteochondral lesion. No joint effusion.

Subtalar Joints/Sinus Tarsi: The subtalar joints are maintained. The
sinus tarsi is normal. The cervical and interosseous ligaments are
intact. The spring ligament is intact.

Bones: No stress fracture or AVN.

Other: Normal foot musculature.
IMPRESSION: 1. Severe tendinopathy and tenosynovitis involving the peroneal
tendons. The peroneus longus tendon is ruptured distally. The
peroneus brevis tendon is intact but demonstrates interstitial tears
and longitudinal split type tears.
2. Intact medial and lateral ankle ligaments and medial ankle
tendons.
3. No significant degenerative changes or stress fracture.

## 2019-04-01 DIAGNOSIS — S43492A Other sprain of left shoulder joint, initial encounter: Secondary | ICD-10-CM | POA: Diagnosis not present

## 2019-04-01 DIAGNOSIS — M94212 Chondromalacia, left shoulder: Secondary | ICD-10-CM | POA: Diagnosis not present

## 2019-04-01 DIAGNOSIS — S43432A Superior glenoid labrum lesion of left shoulder, initial encounter: Secondary | ICD-10-CM | POA: Diagnosis not present

## 2019-04-01 DIAGNOSIS — G8918 Other acute postprocedural pain: Secondary | ICD-10-CM | POA: Diagnosis not present

## 2019-04-01 DIAGNOSIS — S46012A Strain of muscle(s) and tendon(s) of the rotator cuff of left shoulder, initial encounter: Secondary | ICD-10-CM | POA: Diagnosis not present

## 2019-04-01 DIAGNOSIS — M19012 Primary osteoarthritis, left shoulder: Secondary | ICD-10-CM | POA: Diagnosis not present

## 2019-04-01 DIAGNOSIS — S46192A Other injury of muscle, fascia and tendon of long head of biceps, left arm, initial encounter: Secondary | ICD-10-CM | POA: Diagnosis not present

## 2019-04-01 DIAGNOSIS — M7542 Impingement syndrome of left shoulder: Secondary | ICD-10-CM | POA: Diagnosis not present

## 2019-04-01 DIAGNOSIS — X58XXXA Exposure to other specified factors, initial encounter: Secondary | ICD-10-CM | POA: Diagnosis not present

## 2019-04-01 DIAGNOSIS — Y999 Unspecified external cause status: Secondary | ICD-10-CM | POA: Diagnosis not present

## 2019-04-07 DIAGNOSIS — M25512 Pain in left shoulder: Secondary | ICD-10-CM | POA: Diagnosis not present

## 2019-04-07 DIAGNOSIS — M25612 Stiffness of left shoulder, not elsewhere classified: Secondary | ICD-10-CM | POA: Diagnosis not present

## 2019-04-13 DIAGNOSIS — M25612 Stiffness of left shoulder, not elsewhere classified: Secondary | ICD-10-CM | POA: Diagnosis not present

## 2019-04-13 DIAGNOSIS — M25512 Pain in left shoulder: Secondary | ICD-10-CM | POA: Diagnosis not present

## 2019-04-14 DIAGNOSIS — Z4789 Encounter for other orthopedic aftercare: Secondary | ICD-10-CM | POA: Diagnosis not present

## 2019-04-21 DIAGNOSIS — M25512 Pain in left shoulder: Secondary | ICD-10-CM | POA: Diagnosis not present

## 2019-04-28 DIAGNOSIS — M25512 Pain in left shoulder: Secondary | ICD-10-CM | POA: Diagnosis not present

## 2019-05-05 DIAGNOSIS — M25612 Stiffness of left shoulder, not elsewhere classified: Secondary | ICD-10-CM | POA: Diagnosis not present

## 2019-05-05 DIAGNOSIS — M25512 Pain in left shoulder: Secondary | ICD-10-CM | POA: Diagnosis not present

## 2019-05-11 DIAGNOSIS — M25512 Pain in left shoulder: Secondary | ICD-10-CM | POA: Diagnosis not present

## 2019-05-13 DIAGNOSIS — M25512 Pain in left shoulder: Secondary | ICD-10-CM | POA: Diagnosis not present

## 2019-05-18 DIAGNOSIS — M25512 Pain in left shoulder: Secondary | ICD-10-CM | POA: Diagnosis not present

## 2019-05-20 DIAGNOSIS — M25512 Pain in left shoulder: Secondary | ICD-10-CM | POA: Diagnosis not present

## 2019-05-25 DIAGNOSIS — M25512 Pain in left shoulder: Secondary | ICD-10-CM | POA: Diagnosis not present

## 2019-06-01 DIAGNOSIS — M25512 Pain in left shoulder: Secondary | ICD-10-CM | POA: Diagnosis not present

## 2019-06-03 DIAGNOSIS — M25512 Pain in left shoulder: Secondary | ICD-10-CM | POA: Diagnosis not present

## 2019-06-10 DIAGNOSIS — M25612 Stiffness of left shoulder, not elsewhere classified: Secondary | ICD-10-CM | POA: Diagnosis not present

## 2019-06-10 DIAGNOSIS — M25512 Pain in left shoulder: Secondary | ICD-10-CM | POA: Diagnosis not present

## 2019-06-15 DIAGNOSIS — M25512 Pain in left shoulder: Secondary | ICD-10-CM | POA: Diagnosis not present

## 2019-06-18 DIAGNOSIS — M25512 Pain in left shoulder: Secondary | ICD-10-CM | POA: Diagnosis not present

## 2019-06-18 DIAGNOSIS — M25612 Stiffness of left shoulder, not elsewhere classified: Secondary | ICD-10-CM | POA: Diagnosis not present

## 2019-07-14 ENCOUNTER — Encounter: Payer: Self-pay | Admitting: Internal Medicine

## 2019-07-14 ENCOUNTER — Other Ambulatory Visit: Payer: Self-pay

## 2019-07-14 ENCOUNTER — Other Ambulatory Visit (INDEPENDENT_AMBULATORY_CARE_PROVIDER_SITE_OTHER): Payer: BC Managed Care – PPO

## 2019-07-14 ENCOUNTER — Telehealth (INDEPENDENT_AMBULATORY_CARE_PROVIDER_SITE_OTHER): Payer: BC Managed Care – PPO | Admitting: Internal Medicine

## 2019-07-14 DIAGNOSIS — K625 Hemorrhage of anus and rectum: Secondary | ICD-10-CM | POA: Diagnosis not present

## 2019-07-14 DIAGNOSIS — R1012 Left upper quadrant pain: Secondary | ICD-10-CM

## 2019-07-14 LAB — COMPREHENSIVE METABOLIC PANEL
ALT: 18 U/L (ref 0–35)
AST: 16 U/L (ref 0–37)
Albumin: 4.4 g/dL (ref 3.5–5.2)
Alkaline Phosphatase: 65 U/L (ref 39–117)
BUN: 14 mg/dL (ref 6–23)
CO2: 28 mEq/L (ref 19–32)
Calcium: 9.7 mg/dL (ref 8.4–10.5)
Chloride: 104 mEq/L (ref 96–112)
Creatinine, Ser: 0.87 mg/dL (ref 0.40–1.20)
GFR: 67.75 mL/min (ref >60.00)
Glucose, Bld: 95 mg/dL (ref 70–99)
Potassium: 4.1 mEq/L (ref 3.5–5.1)
Sodium: 139 mEq/L (ref 135–145)
Total Bilirubin: 0.3 mg/dL (ref 0.2–1.2)
Total Protein: 7.6 g/dL (ref 6.0–8.3)

## 2019-07-14 LAB — CBC WITH DIFFERENTIAL/PLATELET
Basophils Absolute: 0 10*3/uL (ref 0.0–0.1)
Basophils Relative: 0.4 % (ref 0.0–3.0)
Eosinophils Absolute: 0.1 10*3/uL (ref 0.0–0.7)
Eosinophils Relative: 1.6 % (ref 0.0–5.0)
HCT: 42.3 % (ref 36.0–46.0)
Hemoglobin: 14.6 g/dL (ref 12.0–15.0)
Lymphocytes Relative: 34.2 % (ref 12.0–46.0)
Lymphs Abs: 2.3 10*3/uL (ref 0.7–4.0)
MCHC: 34.6 g/dL (ref 30.0–36.0)
MCV: 94.5 fl (ref 78.0–100.0)
Monocytes Absolute: 0.3 10*3/uL (ref 0.1–1.0)
Monocytes Relative: 4.4 % (ref 3.0–12.0)
Neutro Abs: 4.1 10*3/uL (ref 1.4–7.7)
Neutrophils Relative %: 59.4 % (ref 43.0–77.0)
Platelets: 238 10*3/uL (ref 150.0–400.0)
RBC: 4.47 Mil/uL (ref 3.87–5.11)
RDW: 12 % (ref 11.5–15.5)
WBC: 6.9 10*3/uL (ref 4.0–10.5)

## 2019-07-14 LAB — LIPASE: Lipase: 43 U/L (ref 11.0–59.0)
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
## 2019-07-14 NOTE — Progress Notes (Signed)
Good news   blood count and  chemistry liver and pancrease  enzyme ( lipase) all normal     so no anemia  from bleeding

## 2019-07-28 DIAGNOSIS — Z4789 Encounter for other orthopedic aftercare: Secondary | ICD-10-CM | POA: Diagnosis not present

## 2019-07-28 DIAGNOSIS — K625 Hemorrhage of anus and rectum: Secondary | ICD-10-CM | POA: Diagnosis not present

## 2019-07-28 DIAGNOSIS — K219 Gastro-esophageal reflux disease without esophagitis: Secondary | ICD-10-CM | POA: Diagnosis not present

## 2019-07-28 DIAGNOSIS — R1012 Left upper quadrant pain: Secondary | ICD-10-CM | POA: Diagnosis not present

## 2019-10-13 LAB — HM PAP SMEAR

## 2019-12-29 ENCOUNTER — Encounter: Payer: Self-pay | Admitting: Internal Medicine

## 2019-12-29 ENCOUNTER — Ambulatory Visit: Payer: 59 | Admitting: Internal Medicine

## 2019-12-29 ENCOUNTER — Other Ambulatory Visit: Payer: Self-pay

## 2019-12-29 VITALS — BP 118/78 | HR 70 | Temp 98.4°F | Ht 65.5 in | Wt 172.8 lb

## 2019-12-29 DIAGNOSIS — Z0000 Encounter for general adult medical examination without abnormal findings: Secondary | ICD-10-CM
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
## 2019-12-29 NOTE — Patient Instructions (Signed)
Glad you are doing well.     Health Maintenance, Female Adopting a healthy lifestyle and getting preventive care are important in promoting health and wellness. Ask your health care provider about:  The right schedule for you to have regular tests and exams.  Things you can do on your own to prevent diseases and keep yourself healthy. What should I know about diet, weight, and exercise? Eat a healthy diet   Eat a diet that includes plenty of vegetables, fruits, low-fat dairy products, and lean protein.  Do not eat a lot of foods that are high in solid fats, added sugars, or sodium. Maintain a healthy weight Body mass index (BMI) is used to identify weight problems. It estimates body fat based on height and weight. Your health care provider can help determine your BMI and help you achieve or maintain a healthy weight. Get regular exercise Get regular exercise. This is one of the most important things you can do for your health. Most adults should:  Exercise for at least 150 minutes each week. The exercise should increase your heart rate and make you sweat (moderate-intensity exercise).  Do strengthening exercises at least twice a week. This is in addition to the moderate-intensity exercise.  Spend less time sitting. Even light physical activity can be beneficial. Watch cholesterol and blood lipids Have your blood tested for lipids and cholesterol at 55 years of age, then have this test every 5 years. Have your cholesterol levels checked more often if:  Your lipid or cholesterol levels are high.  You are older than 55 years of age.  You are at high risk for heart disease. What should I know about cancer screening? Depending on your health history and family history, you may need to have cancer screening at various ages. This may include screening for:  Breast cancer.  Cervical cancer.  Colorectal cancer.  Skin cancer.  Lung cancer. What should I know about heart disease,  diabetes, and high blood pressure? Blood pressure and heart disease  High blood pressure causes heart disease and increases the risk of stroke. This is more likely to develop in people who have high blood pressure readings, are of African descent, or are overweight.  Have your blood pressure checked: ? Every 3-5 years if you are 40-57 years of age. ? Every year if you are 30 years old or older. Diabetes Have regular diabetes screenings. This checks your fasting blood sugar level. Have the screening done:  Once every three years after age 71 if you are at a normal weight and have a low risk for diabetes.  More often and at a younger age if you are overweight or have a high risk for diabetes. What should I know about preventing infection? Hepatitis B If you have a higher risk for hepatitis B, you should be screened for this virus. Talk with your health care provider to find out if you are at risk for hepatitis B infection. Hepatitis C Testing is recommended for:  Everyone born from [birth year] through 1966.  Anyone with known risk factors for hepatitis C. Sexually transmitted infections (STIs)  Get screened for STIs, including gonorrhea and chlamydia, if: ? You are sexually active and are younger than 55 years of age. ? You are older than 55 years of age and your health care provider tells you that you are at risk for this type of infection. ? Your sexual activity has changed since you were last screened, and you are at increased risk for  chlamydia or gonorrhea. Ask your health care provider if you are at risk.  Ask your health care provider about whether you are at high risk for HIV. Your health care provider may recommend a prescription medicine to help prevent HIV infection. If you choose to take medicine to prevent HIV, you should first get tested for HIV. You should then be tested every 3 months for as long as you are taking the medicine. Pregnancy  If you are about to stop having your  period (premenopausal) and you may become pregnant, seek counseling before you get pregnant.  Take 400 to 800 micrograms (mcg) of folic acid every day if you become pregnant.  Ask for birth control (contraception) if you want to prevent pregnancy. Osteoporosis and menopause Osteoporosis is a disease in which the bones lose minerals and strength with aging. This can result in bone fractures. If you are 27 years old or older, or if you are at risk for osteoporosis and fractures, ask your health care provider if you should:  Be screened for bone loss.  Take a calcium or vitamin D supplement to lower your risk of fractures.  Be given hormone replacement therapy (HRT) to treat symptoms of menopause. Follow these instructions at home: Lifestyle  Do not use any products that contain nicotine or tobacco, such as cigarettes, e-cigarettes, and chewing tobacco. If you need help quitting, ask your health care provider.  Do not use street drugs.  Do not share needles.  Ask your health care provider for help if you need support or information about quitting drugs. Alcohol use  Do not drink alcohol if: ? Your health care provider tells you not to drink. ? You are pregnant, may be pregnant, or are planning to become pregnant.  If you drink alcohol: ? Limit how much you use to 0-1 drink a day. ? Limit intake if you are breastfeeding.  Be aware of how much alcohol is in your drink. In the [place] one drink equals one 12 oz bottle of beer (355 mL), one 5 oz glass of wine (148 mL), or one 1 oz glass of hard liquor (44 mL). General instructions  Schedule regular health, dental, and eye exams.  Stay current with your vaccines.  Tell your health care provider if: ? You often feel depressed. ? You have ever been abused or do not feel safe at home. Summary  Adopting a healthy lifestyle and getting preventive care are important in promoting health and wellness.  Follow your health care provider's  instructions about healthy diet, exercising, and getting tested or screened for diseases.  Follow your health care provider's instructions on monitoring your cholesterol and blood pressure. This information is not intended to replace advice given to you by your health care provider. Make sure you discuss any questions you have with your health care provider. Document Revised: 04/22/2018 Document Reviewed: 04/22/2018 Elsevier Patient Education  2020 [employer].

## 2019-12-30 ENCOUNTER — Encounter: Payer: Self-pay | Admitting: Internal Medicine

## 2020-07-27 ENCOUNTER — Encounter (HOSPITAL_BASED_OUTPATIENT_CLINIC_OR_DEPARTMENT_OTHER): Payer: Self-pay | Admitting: Specialist

## 2020-07-27 ENCOUNTER — Other Ambulatory Visit: Payer: Self-pay

## 2020-07-27 NOTE — Progress Notes (Signed)
Spoke w/ via phone for pre-op interview---pt Lab needs dos----       none        Lab results-----none- COVID test ------08-01-2020 800 Arrive at -------900 am 08-02-2020 NPO after MN NO Solid Food.  Clear liquids from MN until---800 am then npo Med rec completed Medications to take morning of surgery -----omeprazole Diabetic medication -----n/a Patient instructed to bring photo id and insurance card day of surgery Patient aware to have Driver (ride ) / caregiver brnadon significant other will stay    for 24 hours after surgery  Patient Special Instructions -----none Pre-Op special Istructions -----none Patient verbalized understanding of instructions that were given at this phone interview. Patient denies shortness of breath, chest pain, fever, cough at this phone interview.

## 2020-08-01 ENCOUNTER — Other Ambulatory Visit (HOSPITAL_COMMUNITY)
Admission: RE | Admit: 2020-08-01 | Discharge: 2020-08-01 | Disposition: A | Discharge status: Home / Self Care | Payer: 59 | Source: Ambulatory Visit | Attending: Specialist | Admitting: Specialist

## 2020-08-01 DIAGNOSIS — Z20822 Contact with and (suspected) exposure to covid-19: Secondary | ICD-10-CM | POA: Insufficient documentation

## 2020-08-01 DIAGNOSIS — Z01812 Encounter for preprocedural laboratory examination: Secondary | ICD-10-CM | POA: Insufficient documentation

## 2020-08-01 LAB — SARS CORONAVIRUS 2 (TAT 6-24 HRS): SARS Coronavirus 2: NEGATIVE

## 2020-08-02 ENCOUNTER — Ambulatory Visit (HOSPITAL_BASED_OUTPATIENT_CLINIC_OR_DEPARTMENT_OTHER): Admission: RE | Admit: 2020-08-02 | Payer: 59 | Source: Home / Self Care | Admitting: Specialist

## 2020-08-02 HISTORY — DX: Personal history of other diseases of the digestive system: Z87.19

## 2020-08-02 HISTORY — DX: Gastro-esophageal reflux disease without esophagitis: K21.9

## 2020-08-02 HISTORY — DX: Other tear of medial meniscus, current injury, unspecified knee, initial encounter: S83.249A

## 2020-08-02 HISTORY — DX: Nausea with vomiting, unspecified: R11.2

## 2020-08-02 HISTORY — DX: Presence of spectacles and contact lenses: Z97.3

## 2020-08-02 HISTORY — DX: Other intervertebral disc degeneration, lumbar region: M51.36

## 2020-08-02 HISTORY — DX: Unspecified osteoarthritis, unspecified site: M19.90

## 2020-08-02 HISTORY — DX: Personal history of urinary calculi: Z87.442

## 2020-08-02 HISTORY — DX: Other specified postprocedural states: Z98.890

## 2020-08-02 HISTORY — DX: Other intervertebral disc displacement, lumbar region: M51.26

## 2020-08-02 HISTORY — DX: Other intervertebral disc degeneration, lumbar region without mention of lumbar back pain or lower extremity pain: M51.369

## 2020-08-02 HISTORY — DX: Other specified postprocedural states: R11.2

## 2020-08-02 SURGERY — ARTHROSCOPY, KNEE, WITH MEDIAL MENISCECTOMY
Anesthesia: General | Site: Knee | Laterality: Right

## 2021-03-01 ENCOUNTER — Encounter: Payer: Self-pay | Admitting: Family Medicine

## 2021-03-01 ENCOUNTER — Telehealth (INDEPENDENT_AMBULATORY_CARE_PROVIDER_SITE_OTHER): Payer: 59 | Admitting: Family Medicine

## 2021-03-01 DIAGNOSIS — U071 COVID-19: Secondary | ICD-10-CM | POA: Diagnosis not present

## 2021-03-01 MED ORDER — BENZONATATE 100 MG PO CAPS: ORAL_CAPSULE | ORAL | 0 refills | Status: DC

## 2021-03-01 MED ORDER — MOLNUPIRAVIR EUA 200MG CAPSULE: 4.0000 | ORAL_CAPSULE | Freq: Two times a day (BID) | ORAL | 0 refills | Status: AC

## 2021-03-01 NOTE — Patient Instructions (Addendum)
  HOME CARE TIPS:   -I sent the medication(s) we discussed to your pharmacy: Meds ordered this encounter  Medications   benzonatate (TESSALON PERLES) 100 MG capsule    Sig: 1-2 capsules up to twice daily    Dispense:  30 capsule    Refill:  0   molnupiravir EUA (LAGEVRIO) 200 mg CAPS capsule    Sig: Take 4 capsules (800 mg total) by mouth 2 (two) times daily for 5 days.    Dispense:  40 capsule    Refill:  0     -I sent in the Covid19 treatment or referral you requested per our discussion. Please see the information provided below and discuss further with the pharmacist/treatment team.    -there is a chance of rebound illness after finishing your treatment. If you become sick again please isolate for an additional 5 days, plus 5 more days of masking.   -can use tylenol or aleve if needed for fevers, aches and pains per instructions  -can use nasal saline a few times per day if you have nasal congestion; sometimes  a short course of Afrin nasal spray for 3 days can help with symptoms as well  -stay hydrated, drink plenty of fluids and eat small healthy meals - avoid dairy  -can take 1000 IU ( ) Vit D3 and 100-500 mg of Vit C daily per instructions  -If the Covid test is positive, check out the [facility] website for more information on home care, transmission and treatment for COVID19  -follow up with your doctor in 2-3 days unless improving and feeling better  -stay home while sick, except to seek medical care. If you have COVID19, ideally it would be best to stay home for a full 10 days since the onset of symptoms PLUS one day of no fever and feeling better. Wear a good mask that fits snugly (such as N95 or KN95) if around others to reduce the risk of transmission.  It was nice to meet you today, and I really hope you are feeling better soon. I help [facility] out with telemedicine visits on Tuesdays and Thursdays and am available for visits on those days. If you have any concerns or  questions following this visit please schedule a follow up visit with your Primary Care doctor or seek care at a local urgent care clinic to avoid delays in care.    Seek in person care or schedule a follow up video visit promptly if your symptoms worsen, new concerns arise or you are not improving with treatment. Call 911 and/or seek emergency care if your symptoms are severe or life threatening.

## 2021-03-01 NOTE — Progress Notes (Signed)
Virtual Visit via Video Note  I connected with Ashleah  on 03/01/21 at  4:40 PM EDT by a video enabled telemedicine application and verified that I am speaking with the correct person using two identifiers.  Location patient: home, [place] Location provider:work or home office Persons participating in the virtual visit: patient, provider  I discussed the limitations of evaluation and management by telemedicine and the availability of in person appointments. The patient expressed understanding and agreed to proceed.   HPI:  Acute telemedicine visit for Covid19: -Onset: yesterday -Symptoms include: cough, fatigue, cough, body aches, runny nose -Denies: fever, CP, SOB, NVD, inability to eat/drink/get out of bed -Pertinent past medical history: see below -Pertinent medication allergies: Allergies  Allergen Reactions   Sertraline Hcl Rash    Rash from chest to neck zoloft  -COVID-19 vaccine status: 2 doses, no boosters  ROS: See pertinent positives and negatives per HPI.  Past Medical History:  Diagnosis Date   Acne    accutane in 2000   Arthritis    fingers and knees   Back pain    Bowel perforation (HCC)    at pelvic surgery   3 2012     Bulging of lumbar intervertebral disc without myelopathy    L 4 to L 5   Endometriosis    GERD (gastroesophageal reflux disease)    History of hiatal hernia    History of kidney stones    Medial meniscus tear right knee   PONV (postoperative nausea and vomiting)    nausea   Right lower lobe pneumonia 11/07/2014   x ray finding    Wears glasses    or contacts    Past Surgical History:  Procedure Laterality Date   ABDOMINAL HYSTERECTOMY  yrs ago   vaginal    left ovary removed  2008   LITHOTRIPSY  last done 1995   x 2   right ovary removed  08/10/10   complication bowel perforation   ROTATOR CUFF REPAIR  November 19th 2020   Left Shoulder, did several different things     Current Outpatient Medications:    benzonatate (TESSALON  PERLES) 100 MG capsule, 1-2 capsules up to twice daily, Disp: 30 capsule, Rfl: 0   diclofenac (VOLTAREN) 75 MG EC tablet, Take 75 mg by mouth 2 (two) times daily., Disp: , Rfl:    estradiol (ESTRACE) 2 MG tablet, Take 2 mg by mouth at bedtime., Disp: , Rfl:    molnupiravir EUA (LAGEVRIO) 200 mg CAPS capsule, Take 4 capsules (800 mg total) by mouth 2 (two) times daily for 5 days., Disp: 40 capsule, Rfl: 0   omeprazole (PRILOSEC) 20 MG capsule, daily., Disp: , Rfl:   EXAM:  VITALS per patient if applicable:  GENERAL: alert, oriented, appears well and in no acute distress  HEENT: atraumatic, conjunttiva clear, no obvious abnormalities on inspection of external nose and ears  NECK: normal movements of the head and neck  LUNGS: on inspection no signs of respiratory distress, breathing rate appears normal, no obvious gross SOB, gasping or wheezing  CV: no obvious cyanosis  MS: moves all visible extremities without noticeable abnormality  PSYCH/NEURO: pleasant and cooperative, no obvious depression or anxiety, speech and thought processing grossly intact  ASSESSMENT AND PLAN:  Discussed the following assessment and plan:  COVID-19   Discussed treatment options (infusions and oral options and risk of drug interactions), ideal treatment window, potential complications, isolation and precautions for COVID-19.  Discussed possibility of rebound with antivirals and the  need to reisolate if it should occur for 5 days. Checked for/reviewed any labs done in the last 90 days with GFR listed in HPI if available - none available. After lengthy discussion, the patient opted for treatment with Legevrio due to being higher risk for complications of covid or severe disease and other factors. She wanted antiviral due to her hx of pneumonia, she also is not fully vaccinated and is overweight/possibly obese. Discussed EUA status of this drug and the fact that there is preliminary limited knowledge of  risks/interactions/side effects per EUA document vs possible benefits and precautions. This information was shared with patient during the visit and also was provided in patient instructions. Also, advised that patient discuss risks/interactions and use with pharmacist/treatment team as well. The patient did want a prescription for cough, Tessalon Rx sent.  Other symptomatic care measures summarized in patient instructions. Advised to seek prompt in person care if worsening, new symptoms arise, or if is not improving with treatment. Discussed options for inperson care if PCP office not available. Did let this patient know that I only do telemedicine on Tuesdays and Thursdays for [place]. Advised to schedule follow up visit with PCP or [facility] if any further questions or concerns to avoid delays in care.   I discussed the assessment and treatment plan with the patient. The patient was provided an opportunity to ask questions and all were answered. The patient agreed with the plan and demonstrated an understanding of the instructions.     Terressa Koyanagi, DO

## 2021-10-31 ENCOUNTER — Ambulatory Visit (INDEPENDENT_AMBULATORY_CARE_PROVIDER_SITE_OTHER): Payer: Managed Care, Other (non HMO)

## 2021-10-31 ENCOUNTER — Ambulatory Visit: Payer: Managed Care, Other (non HMO) | Admitting: Podiatry

## 2021-10-31 DIAGNOSIS — M84375A Stress fracture, left foot, initial encounter for fracture: Secondary | ICD-10-CM | POA: Diagnosis not present

## 2021-10-31 MED ORDER — METHYLPREDNISOLONE 4 MG PO TBPK
ORAL_TABLET | ORAL | 0 refills | Status: AC
Start: 2021-10-31 — End: ?

## 2021-10-31 NOTE — Progress Notes (Signed)
HPI: 56 y.o. female presenting today as a reestablish new patient for evaluation of left forefoot pain to the fourth and fifth toe areas for the past 6 weeks.  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  She has noticed increased pain and tenderness to the forefoot.  She does have a past surgical history of peroneal tendon repair LLE.  DOS: 07/09/2016.  Patient states that the posterior aspect of the ankle where the incision is is feeling well.  Most recently she has been taking Tylenol for the pain of the forefoot  Past Medical History:  Diagnosis Date   Acne    accutane in 2000   Arthritis    fingers and knees   Back pain    Bowel perforation (HCC)    at pelvic surgery   3 2012     Bulging of lumbar intervertebral disc without myelopathy    L 4 to L 5   Endometriosis    GERD (gastroesophageal reflux disease)    History of hiatal hernia    History of kidney stones    Medial meniscus tear right knee   PONV (postoperative nausea and vomiting)    nausea   Right lower lobe pneumonia 11/07/2014   x ray finding    Wears glasses    or contacts    Past Surgical History:  Procedure Laterality Date   ABDOMINAL HYSTERECTOMY  yrs ago   vaginal    left ovary removed  2008   LITHOTRIPSY  last done 1995   x 2   right ovary removed  08/10/10   complication bowel perforation   ROTATOR CUFF REPAIR  November 19th 2020   Left Shoulder, did several different things    Allergies  Allergen Reactions   Sertraline Hcl Rash    Rash from chest to neck zoloft     Physical Exam: General: The patient is alert and oriented x3 in no acute distress.  Dermatology: Skin is warm, dry and supple bilateral lower extremities. Negative for open lesions or macerations.  Vascular: Palpable pedal pulses bilaterally. Capillary refill within normal limits.  Negative for any significant edema or  erythema  Neurological: Light touch and protective threshold grossly intact  Musculoskeletal Exam: No pedal deformities noted.  Pain on palpation directly along the distal part of the fourth metatarsal left foot consistent with clinical finding of stress reaction fracture/overuse injury  Radiographic Exam:  Normal osseous mineralization. Joint spaces preserved. No fracture/dislocation/boney destruction.  Unable to identify any cortical irregularity or stress reaction fracture   Assessment: 1.  Stress fracture fourth metatarsal left foot based on clinical exam 2. PSxHx peroneal tendon repair LT.  DOS: 07/09/2016  Plan of Care:  1. Patient evaluated. X-Rays reviewed.  2.  I do believe the patient is suffering from a stress fracture/overuse injury of the left forefoot given the history of increased walking and exercising as well as standing on her feet.  Recommend rest ice compression and elevation 3.  Cam boot dispensed.  Weightbearing as tolerated x4 weeks 4.  Prescription for Medrol Dosepak.  Then continue diclofenac 75 mg 2 times daily which the patient already has a prescription for 5.  Return to clinic 4 weeks      Felecia Shelling, DPM [facility]  Dr. Felecia Shelling, DPM    [address]  [place], [place] [address]                Office [phone]  Fax [phone]

## 2021-11-28 ENCOUNTER — Ambulatory Visit: Payer: Managed Care, Other (non HMO) | Admitting: Podiatry

## 2021-11-28 ENCOUNTER — Ambulatory Visit: Payer: Managed Care, Other (non HMO)

## 2021-11-28 DIAGNOSIS — M84375A Stress fracture, left foot, initial encounter for fracture: Secondary | ICD-10-CM

## 2021-11-28 NOTE — Progress Notes (Signed)
Chief Complaint  Patient presents with   Follow-up    Patient is here for 4 week follow-up for left foot pain, the patient states that the pain shoots down into the last 3 toes, the pain can be sharp and continuous at times, walking a lot is aggravating, she is taking the inflammatory medication 2 times a day.    HPI: 56 y.o. female presenting today for follow-up evaluation of pain and tenderness to the left midfoot.  Patient states that she was wearing the cam boot for a few weeks however this past weekend she went to take a tour at [facility] baseball field and did a significant amount of walking up and down stairs without the boot.  She noticed significant amount of increased pain and tenderness with swelling to the foot.  She presents for follow-up treatment and evaluation  Past Medical History:  Diagnosis Date   Acne    accutane in 2000   Arthritis    fingers and knees   Back pain    Bowel perforation (HCC)    at pelvic surgery   3 2012     Bulging of lumbar intervertebral disc without myelopathy    L 4 to L 5   Endometriosis    GERD (gastroesophageal reflux disease)    History of hiatal hernia    History of kidney stones    Medial meniscus tear right knee   PONV (postoperative nausea and vomiting)    nausea   Right lower lobe pneumonia 11/07/2014   x ray finding    Wears glasses    or contacts    Past Surgical History:  Procedure Laterality Date   ABDOMINAL HYSTERECTOMY  yrs ago   vaginal    left ovary removed  2008   LITHOTRIPSY  last done 1995   x 2   right ovary removed  08/10/10   complication bowel perforation   ROTATOR CUFF REPAIR  November 19th 2020   Left Shoulder, did several different things    Allergies  Allergen Reactions   Sertraline Hcl Rash    Rash from chest to neck zoloft     Physical Exam: General: The patient is alert and oriented x3 in no acute distress.  Dermatology: Skin is warm, dry and supple bilateral lower extremities. Negative  for open lesions or macerations.  Vascular: Palpable pedal pulses bilaterally. Capillary refill within normal limits.  Today there is a significant amount of edema localized around the lateral aspect of the left midfoot Neurological: Light touch and protective threshold grossly intact  Musculoskeletal Exam: No pedal deformities noted.  Pain on palpation throughout the entire lateral aspect of the left foot  Radiographic Exam:  Normal osseous mineralization. Joint spaces preserved.  Today there is a visible transverse fracture of the fourth metatarsal of the left foot consistent with a stress reaction fracture.  Nondisplaced, closed.  Assessment: 1.  Stress fracture fourth metatarsal left foot 2. PSxHx peroneal tendon repair LT.  DOS: 07/09/2016  Plan of Care:  1. Patient evaluated. X-Rays reviewed which are positive for findings of stress reaction fracture to the fourth metatarsal left foot 2.  Stressed the importance of reducing activity, rest ice compression and elevation, and minimal weightbearing in the cam boot 3.  Recommend weightbearing in the cam boot x6 weeks 4.  Continue diclofenac 75 mg twice daily as needed 5.  Return to clinic in 6 weeks for follow-up x-ray      Felecia Shelling, DPM [facility]  Dr. Edrick Kins, DPM    [address], [place] [address]                Office [phone]  Fax [phone]

## 2022-01-09 ENCOUNTER — Ambulatory Visit (INDEPENDENT_AMBULATORY_CARE_PROVIDER_SITE_OTHER): Payer: Managed Care, Other (non HMO)

## 2022-01-09 ENCOUNTER — Ambulatory Visit: Payer: Managed Care, Other (non HMO) | Admitting: Podiatry

## 2022-01-09 DIAGNOSIS — M84375A Stress fracture, left foot, initial encounter for fracture: Secondary | ICD-10-CM

## 2022-01-09 NOTE — Progress Notes (Signed)
   Chief Complaint  Patient presents with   Follow-up    Patient is here for left foot follow-up, patient states that te bony part feels better.    HPI: 56 y.o. female presenting today for follow-up evaluation of stress fracture to the fourth metatarsal left foot.  Patient has been wearing the cam boot.  She says that she is doing very well and the pain has mostly resolved.  No new complaints at this time  Past Medical History:  Diagnosis Date   Acne    accutane in 2000   Arthritis    fingers and knees   Back pain    Bowel perforation (HCC)    at pelvic surgery   3 2012     Bulging of lumbar intervertebral disc without myelopathy    L 4 to L 5   Endometriosis    GERD (gastroesophageal reflux disease)    History of hiatal hernia    History of kidney stones    Medial meniscus tear right knee   PONV (postoperative nausea and vomiting)    nausea   Right lower lobe pneumonia 11/07/2014   x ray finding    Wears glasses    or contacts    Past Surgical History:  Procedure Laterality Date   ABDOMINAL HYSTERECTOMY  yrs ago   vaginal    left ovary removed  2008   LITHOTRIPSY  last done 1995   x 2   right ovary removed  08/10/10   complication bowel perforation   ROTATOR CUFF REPAIR  November 19th 2020   Left Shoulder, did several different things    Allergies  Allergen Reactions   Sertraline Hcl Rash    Rash from chest to neck zoloft     Physical Exam: General: The patient is alert and oriented x3 in no acute distress.  Dermatology: Skin is warm, dry and supple bilateral lower extremities. Negative for open lesions or macerations.  Vascular: Palpable pedal pulses bilaterally. Capillary refill within normal limits.  Today there is a significant amount of edema localized around the lateral aspect of the left midfoot Neurological: Light touch and protective threshold grossly intact  Musculoskeletal Exam: No pedal deformities noted.  Negative for any significant pain on  palpation throughout the entire lateral aspect of the left foot  Radiographic Exam LT foot 01/09/2022:  Routine healing of the transverse fracture of the fourth metatarsal   Assessment: 1.  Stress fracture fourth metatarsal left foot 2. PSxHx peroneal tendon repair LT.  DOS: 07/09/2016  Plan of Care:  1. Patient evaluated.  2.  Patient may discontinue the cam boot.  Recommend good supportive sneakers in tennis shoes 3.  Patient may slowly increase to full activity no restrictions 4.  Return to clinic as needed     Felecia Shelling, DPM [facility]  Dr. Felecia Shelling, DPM    [address] [place], [place] [address]                Office [phone]  Fax [phone]

## 2022-07-10 ENCOUNTER — Ambulatory Visit: Payer: Managed Care, Other (non HMO) | Admitting: Podiatry

## 2022-07-10 ENCOUNTER — Ambulatory Visit (INDEPENDENT_AMBULATORY_CARE_PROVIDER_SITE_OTHER): Payer: Managed Care, Other (non HMO)

## 2022-07-10 DIAGNOSIS — R52 Pain, unspecified: Secondary | ICD-10-CM

## 2022-07-10 DIAGNOSIS — M778 Other enthesopathies, not elsewhere classified: Secondary | ICD-10-CM | POA: Diagnosis not present

## 2022-07-10 DIAGNOSIS — M84375A Stress fracture, left foot, initial encounter for fracture: Secondary | ICD-10-CM

## 2022-07-10 MED ORDER — PREDNISONE 10 MG PO TABS: ORAL_TABLET | ORAL | 0 refills | Status: DC

## 2022-07-10 MED ORDER — BETAMETHASONE SOD PHOS & ACET 6 (3-3) MG/ML IJ SUSP: 3.0000 mg | Freq: Once | INTRAMUSCULAR | Status: AC

## 2022-07-10 NOTE — Addendum Note (Signed)
Addended by: Edrick Kins on: 07/10/2022 09:55 AM   Modules accepted: Orders, Level of Service

## 2022-07-10 NOTE — Progress Notes (Addendum)
Chief Complaint  Patient presents with   Foot Pain    Patient came in today for a left foot pain and swelling, started 2 weeks ago, patient has redness and swelling on the lateral side of the foot and 5th toe, TX: Cam boot, X-rays done today     HPI: 57 y.o. female presenting today for an acute flareup of left foot pain.  Onset about 2 weeks ago.  She denies a history of injury.  Idiopathic onset.  She does have a history of peroneal tendon repair in 2019 to the left lower extremity.  Also past medical history of stress reaction fracture to the left foot last year.  She presents for further treatment and evaluation  Past Medical History:  Diagnosis Date   Acne    accutane in 2000   Arthritis    fingers and knees   Back pain    Bowel perforation ([place])    at pelvic surgery   3 2012     Bulging of lumbar intervertebral disc without myelopathy    L 4 to L 5   Endometriosis    GERD (gastroesophageal reflux disease)    History of hiatal hernia    History of kidney stones    Medial meniscus tear right knee   PONV (postoperative nausea and vomiting)    nausea   Right lower lobe pneumonia 11/07/2014   x ray finding    Wears glasses    or contacts    Past Surgical History:  Procedure Laterality Date   ABDOMINAL HYSTERECTOMY  yrs ago   vaginal    left ovary removed  2008   LITHOTRIPSY  last done 1995   x 2   right ovary removed  [id number]   complication bowel perforation   ROTATOR CUFF REPAIR  November 19th 2020   Left Shoulder, did several different things    Allergies  Allergen Reactions   Sertraline Hcl Rash    Rash from chest to neck zoloft     Physical Exam: General: The patient is alert and oriented x3 in no acute distress.  Dermatology: Skin is warm, dry and supple bilateral lower extremities. Negative for open lesions or macerations.  Vascular: Palpable pedal pulses bilaterally. Capillary refill within normal limits.  Localized edema to the lateral aspect of the  foot around the fourth and fifth TMT joint  Neurological: Light touch and protective threshold grossly intact  Musculoskeletal Exam: No pedal deformities noted.  There is associated tenderness with palpation around the fourth and fifth TMT joint as well as along the lateral column of the foot  Radiographic Exam LT foot 07/10/2022:  Normal osseous mineralization. Joint spaces preserved.  Persistent subtle stress reaction fracture noted along the base of the fourth metatarsal left foot.  Compared to last x-rays taken 01/09/2022 it appears mostly unchanged with possible reinjury of this area  Assessment: 1.  Capsulitis left fourth and fifth TMT 2.  Possible reinjury of subtle stress reaction fracture base of the fourth metatarsal left -Patient evaluated.  X-rays reviewed -Patient has a cam boot at home.  WBAT -Recommend RICE -Prescription for prednisone 10 mg tablets over a 12-day taper - I do believe the patient would benefit long-term from custom molded orthotics.  She has worn orthotics in the past.  Appointment with orthotics department for custom molded insoles  - Injection of 0.5 cc Celestone Soluspan injected along the lateral column of the left foot  -Return to clinic 6 weeks  Edrick Kins, DPM [facility]  Dr. Edrick Kins, DPM    [address], [place] [address]                Office [phone]  Fax [phone]

## 2022-07-11 ENCOUNTER — Other Ambulatory Visit: Payer: Self-pay | Admitting: Podiatry

## 2022-07-11 DIAGNOSIS — M778 Other enthesopathies, not elsewhere classified: Secondary | ICD-10-CM

## 2022-07-11 DIAGNOSIS — M84375A Stress fracture, left foot, initial encounter for fracture: Secondary | ICD-10-CM

## 2022-07-11 DIAGNOSIS — R52 Pain, unspecified: Secondary | ICD-10-CM

## 2022-07-12 ENCOUNTER — Ambulatory Visit: Payer: Managed Care, Other (non HMO) | Admitting: Podiatry

## 2022-07-15 ENCOUNTER — Ambulatory Visit (INDEPENDENT_AMBULATORY_CARE_PROVIDER_SITE_OTHER): Payer: Managed Care, Other (non HMO)

## 2022-07-15 ENCOUNTER — Telehealth: Payer: Self-pay | Admitting: *Deleted

## 2022-07-15 ENCOUNTER — Ambulatory Visit: Payer: Managed Care, Other (non HMO) | Admitting: Podiatry

## 2022-07-15 DIAGNOSIS — M84375A Stress fracture, left foot, initial encounter for fracture: Secondary | ICD-10-CM | POA: Diagnosis not present

## 2022-07-15 DIAGNOSIS — M778 Other enthesopathies, not elsewhere classified: Secondary | ICD-10-CM

## 2022-07-15 NOTE — Progress Notes (Signed)
Chief Complaint  Patient presents with   Foot Injury    Left foot  patient heard a snap while she was walking, lateral side of the foot yesterday, rate of pain 6 out of 10, TX: cam boot X-rays done today     HPI: 57 y.o. female presenting today for continued left foot pain.  Patient was last seen in the office on 07/10/2022.  Patient does have a past medical history of stress reaction fractures to the left foot.  Patient states that yesterday, 07/14/2022, she was walking in her CAM boot and she heard and felt a snap to the lateral aspect of the foot with increased pain.  She presents for further treatment and evaluation  Past Medical History:  Diagnosis Date   Acne    accutane in 2000   Arthritis    fingers and knees   Back pain    Bowel perforation ([place])    at pelvic surgery   3 2012     Bulging of lumbar intervertebral disc without myelopathy    L 4 to L 5   Endometriosis    GERD (gastroesophageal reflux disease)    History of hiatal hernia    History of kidney stones    Medial meniscus tear right knee   PONV (postoperative nausea and vomiting)    nausea   Right lower lobe pneumonia 11/07/2014   x ray finding    Wears glasses    or contacts    Past Surgical History:  Procedure Laterality Date   ABDOMINAL HYSTERECTOMY  yrs ago   vaginal    left ovary removed  2008   LITHOTRIPSY  last done 1995   x 2   right ovary removed  [id number]   complication bowel perforation   ROTATOR CUFF REPAIR  November 19th 2020   Left Shoulder, did several different things    Allergies  Allergen Reactions   Sertraline Hcl Rash    Rash from chest to neck zoloft     Physical Exam: General: The patient is alert and oriented x3 in no acute distress.  Dermatology: Skin is warm, dry and supple bilateral lower extremities. Negative for open lesions or macerations.  Vascular: Palpable pedal pulses bilaterally. Capillary refill within normal limits.  Localized edema to the lateral aspect of  the foot around the fourth and fifth TMT joint  Neurological: Light touch and protective threshold grossly intact  Musculoskeletal Exam: No pedal deformities noted.  There is associated tenderness with palpation around the fourth and fifth TMT joint as well as along the lateral column of the foot  Radiographic Exam LT foot 07/10/2022:  Normal osseous mineralization. Joint spaces preserved.  Persistent subtle stress reaction fracture noted along the base of the fourth metatarsal left foot.  Compared to last x-rays taken 01/09/2022 it appears mostly unchanged with possible reinjury of this area  Radiographic exam LT foot 07/15/2022: New finding of a transverse stress reaction type fracture along the diaphysis of the fifth metatarsal.  Nondisplaced.  Otherwise no significant change compared to prior x-rays  Assessment: 1.  Capsulitis left fourth and fifth TMT 2.  Possible reinjury of subtle stress reaction fracture base of the fourth metatarsal left 3.  Acute stress reaction fracture fifth metatarsal left foot.  DOI: 07/15/2022  -Patient evaluated.  X-rays reviewed - Continue cam boot.  Patient states that she has a knee scooter at her home.  Recommend nonweightbearing x 4 weeks -Continue RICE -Ace wrap applied.  Recommend compression daily -Return  to clinic 6 weeks follow-up x-ray     Edrick Kins, DPM [facility]  Dr. Edrick Kins, DPM    [address], [place] [address]                Office [phone]  Fax [phone]

## 2022-07-16 NOTE — Telephone Encounter (Signed)
error 

## 2022-07-17 ENCOUNTER — Other Ambulatory Visit: Payer: Managed Care, Other (non HMO)

## 2022-07-18 ENCOUNTER — Telehealth: Payer: Self-pay

## 2022-07-22 ENCOUNTER — Other Ambulatory Visit: Payer: Self-pay

## 2022-07-22 ENCOUNTER — Other Ambulatory Visit: Payer: Self-pay | Admitting: Podiatry

## 2022-07-22 DIAGNOSIS — M84375A Stress fracture, left foot, initial encounter for fracture: Secondary | ICD-10-CM

## 2022-07-22 NOTE — Telephone Encounter (Signed)
Order placed for a knee scooter.  Okay to provide handicap parking placard application.  Please notify patient.  Thanks, Dr. Amalia Hailey

## 2022-07-22 NOTE — Telephone Encounter (Signed)
Order was sent to [facility] for knee scooter and Handicap parking form has been mailed out to the patient. Patient is aware.

## 2022-08-05 ENCOUNTER — Telehealth: Payer: Self-pay

## 2022-08-06 NOTE — Telephone Encounter (Signed)
No need for follow-up appointment.  Please reach out to the patient and let her know we could write her out of work temporarily if she would like to request this.  Otherwise continue WBAT in the cam boot and use the knee scooter and crutches is much as possible.  Thanks, Dr. Amalia Hailey

## 2022-08-14 ENCOUNTER — Telehealth: Payer: Self-pay | Admitting: *Deleted

## 2022-08-14 NOTE — Telephone Encounter (Signed)
Patient has been updated and will f/u on 04/17.

## 2022-08-21 ENCOUNTER — Ambulatory Visit: Payer: Managed Care, Other (non HMO) | Admitting: Podiatry

## 2022-08-22 DIAGNOSIS — M189 Osteoarthritis of first carpometacarpal joint, unspecified: Secondary | ICD-10-CM | POA: Insufficient documentation

## 2022-08-28 ENCOUNTER — Ambulatory Visit: Payer: Managed Care, Other (non HMO) | Admitting: Podiatry

## 2022-08-28 ENCOUNTER — Ambulatory Visit (INDEPENDENT_AMBULATORY_CARE_PROVIDER_SITE_OTHER): Payer: Managed Care, Other (non HMO)

## 2022-08-28 DIAGNOSIS — S92345D Nondisplaced fracture of fourth metatarsal bone, left foot, subsequent encounter for fracture with routine healing: Secondary | ICD-10-CM

## 2022-08-28 DIAGNOSIS — M79672 Pain in left foot: Secondary | ICD-10-CM

## 2022-08-28 NOTE — Progress Notes (Signed)
Chief Complaint  Patient presents with   Fracture    Left foot fracture,     HPI: 57 y.o. female presenting today for continued left foot pain.  Patient was last seen in the office on 07/10/2022.  Patient does have a past medical history of stress reaction fractures to the left foot.  She continues to have pain and tenderness.  She also states that she is unable to move her fifth digit to the affected foot.  Compression seems to help.  She has been minimally weightbearing in the cam boot.  Brief history: Patient does have a PMHx stress reaction fractures to the left foot.  Patient states that on 07/14/2022 she was walking in her CAM boot and she heard and felt a snap to the lateral aspect of the foot with increased pain.  She presents for further treatment and evaluation  Past Medical History:  Diagnosis Date   Acne    accutane in 2000   Arthritis    fingers and knees   Back pain    Bowel perforation ([facility])    at pelvic surgery   3 2012     Bulging of lumbar intervertebral disc without myelopathy    L 4 to L 5   Endometriosis    GERD (gastroesophageal reflux disease)    History of hiatal hernia    History of kidney stones    Medial meniscus tear right knee   PONV (postoperative nausea and vomiting)    nausea   Right lower lobe pneumonia 11/07/2014   x ray finding    Wears glasses    or contacts    Past Surgical History:  Procedure Laterality Date   ABDOMINAL HYSTERECTOMY  yrs ago   vaginal    left ovary removed  2008   LITHOTRIPSY  last done 1995   x 2   right ovary removed  08/10/10   complication bowel perforation   ROTATOR CUFF REPAIR  November 19th 2020   Left Shoulder, did several different things    Allergies  Allergen Reactions   Sertraline Hcl Rash    Rash from chest to neck zoloft     Physical Exam: General: The patient is alert and oriented x3 in no acute distress.  Dermatology: Skin is warm, dry and supple bilateral lower extremities. Negative for open  lesions or macerations.  Vascular: Palpable pedal pulses bilaterally. Capillary refill within normal limits.  Localized edema to the lateral aspect of the foot around the fourth and fifth TMT joint  Neurological: Light touch and protective threshold grossly intact  Musculoskeletal Exam: No pedal deformities noted.  There continues to be tenderness with palpation around the fourth and fifth metatarsals along the lateral column of the foot consistent with patient's fractures.  Loss of plantarflexion of the fifth digit left foot.  Radiographic Exam LT foot 07/10/2022:  Normal osseous mineralization. Joint spaces preserved.  Persistent subtle stress reaction fracture noted along the base of the fourth metatarsal left foot.  Compared to last x-rays taken 01/09/2022 it appears mostly unchanged with possible reinjury of this area  Radiographic exam LT foot 07/15/2022: New finding of a transverse stress reaction type fracture along the diaphysis of the fifth metatarsal.  Nondisplaced.  Otherwise no significant change compared to prior x-rays  Radiographic exam LT foot 08/28/2022: Transverse fractures of the fourth and fifth metatarsals noted with routine healing.  Good potential for healing.  Nondisplaced.  No additional acute findings noted  Assessment: 1.  Fracture fourth and  fifth metatarsals left secondary to injury; closed, nondisplaced, subsequent encounter.  DOI: 07/15/2022 2.  Loss of motion fifth digit left foot   -Patient evaluated.  X-rays reviewed and compared to prior x-rays -Continue cam boot minimal WBAT with the assistance of the knee scooter -MRI ordered LT foot wo contrast to evaluate for possible tendon pathology or trauma -Patient also has history of multiple recurrent stress fractures.  Order placed for vitamin D levels.  If they are low she may benefit from prescription vitamin D supplement -Will plan to contact the patient via telephone to discuss results and further treatment  options   Felecia Shelling, DPM [facility]  Dr. Felecia Shelling, DPM    [address] [place], [place] [address]                Office [phone]  Fax [phone]

## 2022-08-31 LAB — VITAMIN D 25 HYDROXY (VIT D DEFICIENCY, FRACTURES): Vit D, 25-Hydroxy: 47 ng/mL (ref 30–100)

## 2022-09-04 ENCOUNTER — Telehealth: Payer: Self-pay | Admitting: *Deleted

## 2022-09-04 NOTE — Telephone Encounter (Signed)
Patient is calling for the status of her MRI scheduling, left voice message that the referral has been placed w/ [facility], was given their number to contact if she has not heard from them in a few days.

## 2022-09-09 ENCOUNTER — Other Ambulatory Visit: Payer: Self-pay | Admitting: Podiatry

## 2022-09-09 DIAGNOSIS — S92345D Nondisplaced fracture of fourth metatarsal bone, left foot, subsequent encounter for fracture with routine healing: Secondary | ICD-10-CM

## 2022-09-09 DIAGNOSIS — M79672 Pain in left foot: Secondary | ICD-10-CM

## 2022-09-13 ENCOUNTER — Encounter: Payer: Self-pay | Admitting: Podiatry

## 2022-09-18 ENCOUNTER — Ambulatory Visit
Admission: RE | Admit: 2022-09-18 | Discharge: 2022-09-18 | Discharge status: Home / Self Care | Payer: Managed Care, Other (non HMO) | Source: Ambulatory Visit | Attending: Podiatry

## 2022-09-18 DIAGNOSIS — S92345D Nondisplaced fracture of fourth metatarsal bone, left foot, subsequent encounter for fracture with routine healing: Secondary | ICD-10-CM

## 2022-09-23 ENCOUNTER — Telehealth: Payer: Self-pay | Admitting: Podiatry

## 2022-09-23 NOTE — Telephone Encounter (Signed)
Pt called in stating that her Results are in her Mychart portal.   Please advise

## 2022-09-26 ENCOUNTER — Encounter: Payer: Self-pay | Admitting: Podiatry

## 2022-09-30 DIAGNOSIS — S92353A Displaced fracture of fifth metatarsal bone, unspecified foot, initial encounter for closed fracture: Secondary | ICD-10-CM | POA: Insufficient documentation

## 2022-09-30 DIAGNOSIS — G5792 Unspecified mononeuropathy of left lower limb: Secondary | ICD-10-CM | POA: Insufficient documentation

## 2023-05-16 ENCOUNTER — Telehealth: Payer: Self-pay | Admitting: Internal Medicine

## 2023-05-16 NOTE — Telephone Encounter (Signed)
 Spoke to pt about sch annual physical, pt states she will call back. Pls sch pt for physical appt.

## 2024-03-17 ENCOUNTER — Telehealth: Payer: Self-pay | Admitting: Internal Medicine

## 2024-03-17 NOTE — Telephone Encounter (Signed)
 Please see message and advise.   Copied from CRM #[id number]. Topic: Appointments - Scheduling Inquiry for Clinic >> Mar 16, 2024  4:03 PM Nessti S wrote: Reason for CRM: pt called because she wants to establish care with dr hunter. Her mom sheryl huskey and dad don alpheus are current patients. Call back number [phone]

## 2024-03-18 NOTE — Telephone Encounter (Signed)
 Yes thanks since I see family I will accept

## 2024-04-30 ENCOUNTER — Encounter: Payer: Self-pay | Admitting: Family Medicine

## 2024-04-30 ENCOUNTER — Ambulatory Visit: Admitting: Family Medicine

## 2024-04-30 ENCOUNTER — Encounter (INDEPENDENT_AMBULATORY_CARE_PROVIDER_SITE_OTHER): Payer: Self-pay

## 2024-04-30 VITALS — BP 128/72 | HR 68 | Temp 98.4°F | Ht 66.0 in | Wt 172.8 lb

## 2024-04-30 DIAGNOSIS — R1310 Dysphagia, unspecified: Secondary | ICD-10-CM

## 2024-04-30 DIAGNOSIS — E663 Overweight: Secondary | ICD-10-CM

## 2024-04-30 DIAGNOSIS — K5900 Constipation, unspecified: Secondary | ICD-10-CM

## 2024-04-30 DIAGNOSIS — N809 Endometriosis, unspecified: Secondary | ICD-10-CM

## 2024-04-30 DIAGNOSIS — E8940 Asymptomatic postprocedural ovarian failure: Secondary | ICD-10-CM

## 2024-04-30 DIAGNOSIS — R1013 Epigastric pain: Secondary | ICD-10-CM

## 2024-04-30 DIAGNOSIS — Z79890 Hormone replacement therapy (postmenopausal): Secondary | ICD-10-CM | POA: Diagnosis not present

## 2024-04-30 DIAGNOSIS — Z90710 Acquired absence of both cervix and uterus: Secondary | ICD-10-CM | POA: Diagnosis not present

## 2024-04-30 DIAGNOSIS — H9313 Tinnitus, bilateral: Secondary | ICD-10-CM | POA: Diagnosis not present

## 2024-04-30 DIAGNOSIS — M2550 Pain in unspecified joint: Secondary | ICD-10-CM

## 2024-04-30 DIAGNOSIS — K219 Gastro-esophageal reflux disease without esophagitis: Secondary | ICD-10-CM

## 2024-04-30 DIAGNOSIS — Z0001 Encounter for general adult medical examination with abnormal findings: Secondary | ICD-10-CM | POA: Diagnosis not present

## 2024-04-30 DIAGNOSIS — Z13220 Encounter for screening for lipoid disorders: Secondary | ICD-10-CM

## 2024-04-30 DIAGNOSIS — Z0000 Encounter for general adult medical examination without abnormal findings: Secondary | ICD-10-CM

## 2024-04-30 DIAGNOSIS — Z131 Encounter for screening for diabetes mellitus: Secondary | ICD-10-CM

## 2024-04-30 NOTE — Patient Instructions (Addendum)
 We have placed a referral for you today to ENT and sports medicine - please call their # in a week  We will call you within two weeks about your referral for CT scan through [facility].  Their phone number is [phone].  Please call them in 1 week or so.   Goal is to move the needle and start exercise at least 40 minutes- fine to do more than this but the biggest thing is STARTING.   I like your idea of trying diclofenac  once a day for onw and see how you do  Please stop by lab before you go If you have mychart- we will send your results within 3 business days of us  receiving them.  If you do not have mychart- we will call you about results within 5 business days of us  receiving them.  *please also note that you will see labs on mychart as soon as they post. I will later go in and write notes on them- will say notes from Dr. Katrinka    Recommended follow up: Return in about 1 year (around 04/30/2025) for physical or sooner if needed.Schedule b4 you leave. BUT if abdominal issue persist want to see you sooner or have you see gastroenterology again

## 2024-04-30 NOTE — Addendum Note (Signed)
 Addended by: EMALINE ELMS on: 04/30/2024 03:59 PM   Modules accepted: Orders

## 2024-04-30 NOTE — Progress Notes (Signed)
 " Phone [phone]   Subjective:  Patient presents today for their annual physical. Chief complaint-noted.   See problem oriented charting- ROS- full  review of systems was completed and negative except for topics noted under acute/chronic concerns   The following were reviewed and entered/updated in epic: Past Medical History:  Diagnosis Date   Acne    accutane in 2000   Allergy    Anxiety    Arthritis    fingers and knees   Back pain    Bowel perforation ([facility])    at pelvic surgery   3 2012     Bulging of lumbar intervertebral disc without myelopathy    L 4 to L 5   Depression    Endometriosis    GERD (gastroesophageal reflux disease)    History of hiatal hernia    History of kidney stones    Medial meniscus tear right knee   PONV (postoperative nausea and vomiting)    nausea   Right lower lobe pneumonia 11/07/2014   x ray finding    Wears glasses    or contacts   Patient Active Problem List   Diagnosis Date Noted   Premature surgical menopause on HRT 03/21/2011    Priority: Medium    Esophageal reflux 12/30/2008    Priority: Medium    DYSPHAGIA UNSPECIFIED 12/30/2008    Priority: Medium    Tinnitus 03/21/2011    Priority: Low   Constipation 01/13/2009    Priority: Low   RENAL CALCULUS, HX OF 01/13/2009    Priority: Low   Endometriosis 12/30/2008    Priority: Low   H/O total hysterectomy 04/30/2024   Past Surgical History:  Procedure Laterality Date   ABDOMINAL HYSTERECTOMY  yrs ago   vaginal    APPENDECTOMY     FRACTURE SURGERY     left ovary removed  2008   LITHOTRIPSY  last done 1995   x 2   right ovary removed  08/10/2010   complication bowel perforation   ROTATOR CUFF REPAIR  November 19th 2020   Left Shoulder, did several different things   SMALL INTESTINE SURGERY     SPINE SURGERY      Family History  Problem Relation Age of Onset   Hypertension Mother    Hyperlipidemia Mother    Hypertension Father    Hyperlipidemia Father     Memory loss Father    Hearing loss Father    Asthma Father    Vasculitis Sister    Coronary artery disease Maternal Grandmother        died at 62 stroke and heart attack- alcoholic   Other Other        hashimotos thyroiditis    Medications- reviewed and updated Current Outpatient Medications  Medication Sig Dispense Refill   diclofenac  (VOLTAREN ) 75 MG EC tablet Take 75 mg by mouth 2 (two) times daily.     estradiol (ESTRACE) 2 MG tablet Take 2 mg by mouth at bedtime.     omeprazole (PRILOSEC) 40 MG capsule Take 40 mg by mouth in the morning and at bedtime.     Current Facility-Administered Medications  Medication Dose Route Frequency Provider Last Rate Last Admin   betamethasone  acetate-betamethasone  sodium phosphate (CELESTONE ) injection 3 mg  3 mg Intra-articular Once Evans, Thresa HERO, DPM        Allergies-reviewed and updated Allergies[1]  Social History   Social History Narrative   Divorced. Lives with fiancee. 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 Oropharynx normal Neck: no thyromegaly CV: RRR no murmurs rubs or gallops Lungs: CTAB no crackles, wheeze, rhonchi Abdomen: soft/nontender other than epigastric mildly RUQ- discrete area ? Hernia even/nondistended/normal bowel sounds. No rebound or guarding.   Ext: no edema Skin: warm, dry Neuro: grossly normal, moves all extremities, PERRLA   Assessment and Plan   58 y.o. female presenting for annual physical.  Health Maintenance counseling: 1. Anticipatory guidance: Patient counseled regarding regular dental exams -q6 months, eye exams - yearly,  avoiding smoking and second hand smoke , limiting alcohol to 1 beverage per day- 1-2 max per month , no illicit drugs - marijuana distant past.   2. Risk factor reduction:  Advised patient of need for regular exercise and diet rich and fruits and vegetables to reduce risk of heart attack and stroke.  Exercise- not lately.  Diet/weight management-down 8 lbs from 2020- some fluctuations- shed like to get to 150. Has tried intermittent fasting. Obsessed over calorie counting- not best fit.  Wt Readings from Last 3 Encounters:  04/30/24 172 lb 12.8 oz (78.4 kg)  12/29/19 172 lb 12.8 oz (78.4 kg)  01/05/19 180 lb 9.6 oz (81.9 kg)  3. Immunizations/screenings/ancillary studies- holding off on COVID, shingrix, flu, prevnar Immunization History  Administered Date(s) Administered   PFIZER(Purple Top)SARS-COV-2 Vaccination 07/26/2019, 08/16/2019   Td 11/14/2003   Tdap 02/22/2015  4. Cervical cancer screening- Works closely with Dr. Johnnye but hysterectomy for benign reasons and no longer getting paps 5. Breast cancer screening-  breast exam with GYn and mammogram - June 2024 6. Colon cancer screening - per [facility] notes will be due for repeat colonoscopy April 2028 with last colonoscopy 2018.  She does have some constipation issues and MiraLAX is helpful or MCT oil 7. Skin cancer screening- no recent checks. advised regular sunscreen use. Denies worrisome, changing, or new skin lesions.  8. Birth control/STD check- monogamous and hysterectomy 9. Osteoporosis screening at 65- reports normal bone density in 2023 10. Smoking associated screening - former smoker- quit in 20's- no regular sceening needed -  social smoking only  Status of chronic or acute concerns   #diclofenac - prior foot reconstruction, shoulder surger, neck sugery, also back pain- this helps all these areas but is a risk for reflux and ulcers- will refer to sports medicine for their help on alleviating pain by other methods. I considered tramadol and I would be willing to prescribe but want to see if they have other thoughts first. Also she is going to ask about the muscle spasm issue- may take several visits.   # Hormone replacement therapy-on Estrace 2 mg-did not tolerate trial of 1 mg in the past.  # GERD/epigastric abdominal pain-patient reports  close follow-up with GI-currently on omeprazole twice dailyand has also used Gaviscon in the past.  NSAID use. Burning sensation and squeezing in left upper quadrant of abdomen and epigastric area intermittently and goes away after 45 minutes. Still had gallbladder- severe pain and has nausea.  -history of hiatal hernia could contribute .  -can hurt straight through to her back.  -avoids fatty foods, spicy foods, salsa, friend etc -also if lays on her back gets a pain almost from nipple straight through to back but literally if presses on chest resolves- feels like muscle spasm. Avoid that position  #loud tinnitus and at times sounds like heart beating in her ears- has been present for years- refer to ENT  Recommended follow up: Return in about 1 year (around 04/30/2025) for physical or sooner if needed.Schedule b4 you leave.  Lab/Order associations:NOT fasting- tea with supplements   ICD-10-CM   1. Preventative health care  Z00.00     2. Tinnitus of both ears  H93.13 Ambulatory referral to ENT    3. Premature surgical menopause on HRT  E89.40    Z79.890     4. Gastroesophageal reflux disease without esophagitis  K21.9     5. Epigastric abdominal pain  R10.13 Comprehensive metabolic panel with GFR    CBC with Differential/Platelet    Amylase    Lipase    CT ABDOMEN PELVIS  W CONTRAST    6. Screening for hyperlipidemia  Z13.220 Lipid panel    7. Screening for diabetes mellitus  Z13.1 Hemoglobin A1c    8. Overweight  E66.3 Hemoglobin A1c    9. Arthralgia, unspecified joint  M25.50 Ambulatory referral to Sports Medicine    10. Endometriosis  N80.9     11. Dysphagia, unspecified type  R13.10     12. Constipation, unspecified constipation type  K59.00     13. H/O total hysterectomy  Z90.710       No orders of the defined types were placed in this encounter.   Return precautions advised.  Garnette Lukes, MD      [1]  Allergies Allergen Reactions   Sertraline Hcl Rash    Rash from chest to neck zoloft   "

## 2024-05-01 LAB — COMPREHENSIVE METABOLIC PANEL WITH GFR
AG Ratio: 1.5 (calc) (ref 1.0–2.5)
ALT: 20 U/L (ref 6–29)
AST: 17 U/L (ref 10–35)
Albumin: 4.3 g/dL (ref 3.6–5.1)
Alkaline phosphatase (APISO): 57 U/L (ref 37–153)
BUN: 18 mg/dL (ref 7–25)
CO2: 27 mmol/L (ref 20–32)
Calcium: 9.1 mg/dL (ref 8.6–10.4)
Chloride: 105 mmol/L (ref 98–110)
Creat: 0.73 mg/dL (ref 0.50–1.03)
Globulin: 2.8 g/dL (ref 1.9–3.7)
Glucose, Bld: 79 mg/dL (ref 65–99)
Potassium: 3.9 mmol/L (ref 3.5–5.3)
Sodium: 140 mmol/L (ref 135–146)
Total Bilirubin: 0.4 mg/dL (ref 0.2–1.2)
Total Protein: 7.1 g/dL (ref 6.1–8.1)
eGFR: 95 mL/min/1.73m2

## 2024-05-01 LAB — AMYLASE: Amylase: 40 U/L (ref 21–101)

## 2024-05-01 LAB — CBC WITH DIFFERENTIAL/PLATELET
Absolute Lymphocytes: 2531 {cells}/uL (ref 850–3900)
Absolute Monocytes: 418 {cells}/uL (ref 200–950)
Basophils Absolute: 53 {cells}/uL (ref 0–200)
Basophils Relative: 0.7 %
Eosinophils Absolute: 137 {cells}/uL (ref 15–500)
Eosinophils Relative: 1.8 %
HCT: 40.3 % (ref 35.9–46.0)
Hemoglobin: 13.2 g/dL (ref 11.7–15.5)
MCH: 31.5 pg (ref 27.0–33.0)
MCHC: 32.8 g/dL (ref 31.6–35.4)
MCV: 96.2 fL (ref 81.4–101.7)
MPV: 10.3 fL (ref 7.5–12.5)
Monocytes Relative: 5.5 %
Neutro Abs: 4461 {cells}/uL (ref 1500–7800)
Neutrophils Relative %: 58.7 %
Platelets: 257 Thousand/uL (ref 140–400)
RBC: 4.19 Million/uL (ref 3.80–5.10)
RDW: 11.7 % (ref 11.0–15.0)
Total Lymphocyte: 33.3 %
WBC: 7.6 Thousand/uL (ref 3.8–10.8)

## 2024-05-01 LAB — HEMOGLOBIN A1C
Hgb A1c MFr Bld: 5.5 %
Mean Plasma Glucose: 111 mg/dL
eAG (mmol/L): 6.2 mmol/L

## 2024-05-01 LAB — LIPID PANEL
Cholesterol: 282 mg/dL — ABNORMAL HIGH
HDL: 70 mg/dL
LDL Cholesterol (Calc): 174 mg/dL — ABNORMAL HIGH
Non-HDL Cholesterol (Calc): 212 mg/dL — ABNORMAL HIGH
Total CHOL/HDL Ratio: 4 (calc)
Triglycerides: 224 mg/dL — ABNORMAL HIGH

## 2024-05-01 LAB — LIPASE: Lipase: 36 U/L (ref 7–60)

## 2024-05-03 ENCOUNTER — Ambulatory Visit: Payer: Self-pay | Admitting: Family Medicine

## 2024-05-12 ENCOUNTER — Other Ambulatory Visit

## 2024-05-18 DIAGNOSIS — Z86718 Personal history of other venous thrombosis and embolism: Secondary | ICD-10-CM | POA: Insufficient documentation

## 2024-05-18 NOTE — Progress Notes (Unsigned)
"       ° °  LILLETTE Ileana Collet, PhD, LAT, ATC acting as a scribe for Artist Lloyd, MD.  [name] [name] is a 59 y.o. female who presents to [facility] at [facility] today for multiple arthralgias.  MS: Chronic joint pain, Chronic wide spread muscle pain, Joint Hypermobility, and Joint Instability Skin/Immune reactions: {EDSDERMIMM:33796} Neurological: {EDSNEURO:33797} ANS: {EDSANS:33798} Respiratory: Vocal Chord Dysfunction CV: {EDSCARDIO:33816} GI:  GERD, IBS with Diarrhea or Constipation, Gastroparesis, and Painful ABD Bloating, dysphagia Genitourinary: {EDSUROGEN:33818}, endometriosis,  Hands & Feet: {EDSHANDSFEET:33819}  Treatments tried: ***  Pertinent review of systems: ***  Relevant historical information: ***   Exam:  There were no vitals taken for this visit. General: Well Developed, well nourished, and in no acute distress.   MSK: ***    Lab and Radiology Results No results found for this or any previous visit (from the past 72 hours). No results found.     Assessment and Plan: 59 y.o. female with ***   PDMP not reviewed this encounter. No orders of the defined types were placed in this encounter.  No orders of the defined types were placed in this encounter.    Discussed warning signs or symptoms. Please see discharge instructions. Patient expresses understanding.   ***  "

## 2024-05-19 ENCOUNTER — Encounter: Payer: Self-pay | Admitting: Family Medicine

## 2024-05-19 ENCOUNTER — Ambulatory Visit: Admitting: Family Medicine

## 2024-05-19 VITALS — BP 124/84 | HR 80 | Ht 66.0 in | Wt 156.0 lb

## 2024-05-19 DIAGNOSIS — R1012 Left upper quadrant pain: Secondary | ICD-10-CM | POA: Diagnosis not present

## 2024-05-19 DIAGNOSIS — M79642 Pain in left hand: Secondary | ICD-10-CM

## 2024-05-19 DIAGNOSIS — G8929 Other chronic pain: Secondary | ICD-10-CM

## 2024-05-19 DIAGNOSIS — M79641 Pain in right hand: Secondary | ICD-10-CM | POA: Diagnosis not present

## 2024-05-19 DIAGNOSIS — M2550 Pain in unspecified joint: Secondary | ICD-10-CM | POA: Diagnosis not present

## 2024-05-19 DIAGNOSIS — M545 Low back pain, unspecified: Secondary | ICD-10-CM

## 2024-05-19 LAB — SEDIMENTATION RATE: Sed Rate: 12 mm/h (ref 0–30)

## 2024-05-19 MED ORDER — CELECOXIB 200 MG PO CAPS: 200.0000 mg | ORAL_CAPSULE | Freq: Two times a day (BID) | ORAL | 2 refills | Status: AC | PRN

## 2024-05-19 NOTE — Addendum Note (Signed)
 Addended by: GEROME ILEANA RAMAN on: 05/19/2024 11:37 AM   Modules accepted: Orders

## 2024-05-19 NOTE — Patient Instructions (Addendum)
 Thank you for coming in today.   I've referred you to Physical Therapy.  Let us  know if you don't hear from them in one week.   Please get labs today before you leave   Try using compression on your hands  Check back after we get the results

## 2024-05-22 LAB — CYCLIC CITRUL PEPTIDE ANTIBODY, IGG: Cyclic Citrullin Peptide Ab: <16 U

## 2024-05-22 LAB — RHEUMATOID FACTOR: Rheumatoid fact SerPl-aCnc: <10 [IU]/mL

## 2024-05-22 LAB — ANA: Anti Nuclear Antibody (ANA): POSITIVE — AB

## 2024-05-22 LAB — ANTI-NUCLEAR AB-TITER (ANA TITER): ANA Titer 1: 1:40 {titer} — ABNORMAL HIGH

## 2024-05-24 ENCOUNTER — Inpatient Hospital Stay
Admission: RE | Admit: 2024-05-24 | Discharge: 2024-05-24 | Disposition: A | Source: Ambulatory Visit | Attending: Family Medicine | Admitting: Family Medicine

## 2024-05-24 ENCOUNTER — Ambulatory Visit: Payer: Self-pay | Admitting: Family Medicine

## 2024-05-24 DIAGNOSIS — R1013 Epigastric pain: Secondary | ICD-10-CM

## 2024-05-24 MED ORDER — IOPAMIDOL (ISOVUE-300) INJECTION 61%
100.0000 mL | Freq: Once | INTRAVENOUS | Status: AC | PRN
  Administered 2024-05-24: 100 mL via INTRAVENOUS

## 2024-05-24 NOTE — Progress Notes (Signed)
 Rheumatology labs are not significantly positive.  1 lab the ANA titer was minimally positive but in isolation does not mean anything.  Plan to proceed to hand therapy.  Please let me know when you get results back from CT scan.

## 2024-05-31 NOTE — Telephone Encounter (Signed)
 Scan ordered by Dr. Katrinka, results in pt chart. Forwarding to Dr. Joane.

## 2024-05-31 NOTE — Therapy (Signed)
 " OUTPATIENT OCCUPATIONAL THERAPY ORTHO EVALUATION  Patient Name: [name] [name] MRN: [id number] DOB:[birth year], 59 y.o., female Today's Date: 06/01/2024  PCP: Katrinka EDISON MD REFERRING PROVIDER: Joane DRAFTS MD   END OF SESSION:  OT End of Session - 06/01/24 1434     Visit Number 1    Number of Visits 5    Date for Recertification  07/02/24    Authorization Type [employer]    OT Start Time 1434    OT Stop Time 1514    OT Time Calculation (min) 40 min          Past Medical History:  Diagnosis Date   Acne    accutane in 2000   Allergy    Anxiety    Arthritis    fingers and knees   Back pain    Bowel perforation (HCC)    at pelvic surgery   3 2012     Bulging of lumbar intervertebral disc without myelopathy    L 4 to L 5   Depression    Endometriosis    GERD (gastroesophageal reflux disease)    History of hiatal hernia    History of kidney stones    Medial meniscus tear right knee   PONV (postoperative nausea and vomiting)    nausea   Right lower lobe pneumonia 11/07/2014   x ray finding    Wears glasses    or contacts   Past Surgical History:  Procedure Laterality Date   ABDOMINAL HYSTERECTOMY  yrs ago   vaginal    APPENDECTOMY     FRACTURE SURGERY     left ovary removed  2008   LITHOTRIPSY  last done 1995   x 2   right ovary removed  08/10/2010   complication bowel perforation   ROTATOR CUFF REPAIR  November 19th 2020   Left Shoulder, did several different things   SMALL INTESTINE SURGERY     SPINE SURGERY     Patient Active Problem List   Diagnosis Date Noted   History of deep vein thrombosis 05/18/2024   H/O total hysterectomy 04/30/2024   Neuritis of left foot 09/30/2022   Closed fracture of fifth metatarsal bone 09/30/2022   Osteoarthritis of carpometacarpal (CMC) joint of thumb 08/22/2022   Arthritis of left acromioclavicular joint 12/21/2018   Complete paralysis of left vocal cord 11/27/2018   Nasal septal perforation 11/27/2018   Impingement  syndrome of left shoulder region 03/10/2018   Premature surgical menopause on HRT 03/21/2011   Tinnitus 03/21/2011   Constipation 01/13/2009   RENAL CALCULUS, HX OF 01/13/2009   Esophageal reflux 12/30/2008   Endometriosis 12/30/2008   DYSPHAGIA UNSPECIFIED 12/30/2008    ONSET DATE: at least 2-3 years   REFERRING DIAG:  M25.50 (ICD-10-CM) - Polyarthralgia  M79.641,M79.642 (ICD-10-CM) - Bilateral hand pain    THERAPY DIAG:  Pain in joint of right hand  Pain in joint of left hand  Muscle weakness (generalized)  Stiffness of left hand, not elsewhere classified  Stiffness of right hand, not elsewhere classified  Rationale for Evaluation and Treatment: Rehabilitation  SUBJECTIVE:   SUBJECTIVE STATEMENT: She states history of pain and arthritis throughout her body, also GI issues from long-term medication use leading to inflammation in her body.  She has equal pain in bil thumb CMC Js with activities, and this is when outpatient OT will be focused on.  She is an public librarian.     PERTINENT HISTORY: From MD visit:  reports polyarthralgia. Notes pain left side chest  wall, under left ribs radiating into the back. Sx started over the summer. Not bothered by activity, worse when laying on her back. Chronic NSAID use, trying to d/c. Also c/o bilat thumb pain. Also c/o left foot pain with hx of surgical repair. Also c/o low back pain radiating into the left gluteal region. Dominant problems are bilateral hand pain due to hand arthritis typically managed with Dr. Camella, chronic left low back pain and left upper abdomen pain radiating to the back.  PRECAUTIONS: None  RED FLAGS: None   WEIGHT BEARING RESTRICTIONS: No  PAIN:  Are you having pain? Yes: NPRS scale: 0/10 at rest, at worst in the past week up to 8/10  Pain location: Bilateral thumb CMC joints Pain description: Aching and painful with activities Aggravating factors: Harsh activities or repetitive motion Relieving  factors: Rest and heat  FALLS: Has patient fallen in last 6 months? No  PLOF: Independent  PATIENT GOALS: To learn how to manage arthritis pain in hands and thumbs to improve daily life and functional activities  NEXT MD VISIT: As needed   OBJECTIVE: (All objective assessments below are from initial evaluation on: 06/01/24 unless otherwise specified.)   HAND DOMINANCE: Right   ADLs: Overall ADLs: States decreased ability to grab, hold household objects, pain and difficulty to open containers, perform FMS tasks (manipulate fasteners on clothing), mild BADL problems as well.    FUNCTIONAL OUTCOME MEASURES: Eval: Patient Specific Functional Scale: 5  (Opening Ziploc's, holding household objects, functional task with thumb use)  (Higher Score  =  Better Ability for the Selected Tasks)      UPPER EXTREMITY ROM    Eval: To help save some time on this evaluation, OT measures right wrist only today, though left wrist has similar performance.  Shoulder to Wrist AROM Right eval  Wrist flexion 59  Wrist extension 73  Wrist ulnar deviation   Wrist radial deviation   (Blank rows = not tested)   Hand AROM Right eval Left eval  Full Fist Ability (or Gap to Distal Palmar Crease) Full fist Full fist  Thumb Opposition  (Kapandji Scale)  9/10 10/10  Thumb MCP (0-60)    Thumb IP (0-80)    Thumb Radial Abduction Span     Thumb Palmar Abduction Span 35  46  (Blank rows = not tested)   HAND FUNCTION: Eval: Observed weakness in affected Rt hand.  Grip strength Right: 44 lbs, Left: 51 lbs   COORDINATION: Eval: Observed mild coordination impairments with affected bilateral hands, due to pain and irritation at the [facility] joints.  Expected to improve with HEP and recommendations.  SENSATION: Eval:  Light touch intact today  EDEMA:   Eval:  Mildly swollen in bilateral hand thumb CMC joint areas  COGNITION: Eval: Overall cognitive status: WFL for evaluation today   OBSERVATIONS:    Eval: She has apparent hyperextension at bilateral thumb MCP joints, showing signs of early collapse or zigzag deformity.  Right appears to be worse than the left.  She has a positive step-off sign at both [facility] joints.  She has pain with activities and stiffness through the thumbs and hands  Bilateral thumb CMC joint arthritis   TODAY'S TREATMENT:  Post-evaluation treatment:   For safety/self-care she was given the information listed under patient instructions.  This includes conservative management techniques for bilateral for bilateral thumb arthritis.  They were reviewed with her.  OT then educates on the following home exercise program to perform 2-3 times a day,  also recommending a warm soak before hand.  Each 1 was done with her to ensure understanding and pain-free performance.  With most of these stretches, she states feeling good relief and feeling better by the end of the session.  Exercises - Seated Wrist Flexion Stretch  - 3 x daily - 3 reps - 15 hold - Wrist Prayer Stretch  - 3 x daily - 3 reps - 15 sec hold - Stretch thumb toward base of small finger (put hand in LAP)  - 2-3 x daily - 3-5 reps - 15 sec hold - Thumb Webspace Stretch  - 3 x daily - 3 reps - 15 sec hold - Towel Roll Grip with Forearm in Neutral  - 3 x daily - 5 reps - 10 sec hold - Spread Index Finger Apart  - 3 x daily - 5 reps - 5 sec hold - C-Strength (try using rubber band)   - 3 x daily - 5 reps - 5 sec hold    PATIENT EDUCATION: Education details: See tx section above for details  Person educated: Patient Education method: Verbal Instruction, Teach back, Handouts  Education comprehension: States and demonstrates understanding, Additional Education required    HOME EXERCISE PROGRAM: Access Code: [email or web address] URL: [email or web address] Date: 06/01/2024 Prepared by: Melvenia Ada   GOALS: Goals reviewed with patient? Yes   SHORT TERM GOALS: (STG required if POC>30 days) Target  Date: 06/01/2024  Pt will demo/state understanding of initial HEP to improve pain levels and prerequisite motion. Goal status: Goal met   LONG TERM GOALS: Target Date: 07/02/2024  Pt will improve functional ability by decreased impairment per PSFS assessment from 5 to 7 or better, for better quality of life. Goal status: INITIAL  2.  Pt will improve grip strength in right hand from 44 lbs to at least 50 lbs for functional use at home and in IADLs. Goal status: INITIAL  3.  Pt will improve A/ROM in right thumb palmar abduction from 35 degrees to at least 45 degrees, to have functional motion for tasks like reach and grasp.  Goal status: INITIAL  4.  Pt will decrease pain at worst from 8/10 to 4/10 or better to have better sleep and occupational participation in daily roles. Goal status: INITIAL   ASSESSMENT:  CLINICAL IMPRESSION: Patient is a 59 y.o. female who was seen today for occupational therapy evaluation for stiffness, weakness, pain, decreased functional ability with bilateral hands due to thumb CMC joint arthritis.  The patient will benefit from outpatient occupational therapy to decrease symptoms, improve functional upper extremity use, and increase quality of life.  PERFORMANCE DEFICITS: in functional skills including ADLs, IADLs, coordination, dexterity, ROM, strength, pain, fascial restrictions, flexibility, body mechanics, endurance, decreased knowledge of precautions, and UE functional use, cognitive skills including problem solving and safety awareness, and psychosocial skills including coping strategies, environmental adaptation, habits, and routines and behaviors.   IMPAIRMENTS: are limiting patient from ADLs, IADLs, rest and sleep, and leisure.   COMORBIDITIES: may have co-morbidities  that affects occupational performance. Patient will benefit from skilled OT to address above impairments and improve overall function.  MODIFICATION OR ASSISTANCE TO COMPLETE  EVALUATION: No modification of tasks or assist necessary to complete an evaluation.  OT OCCUPATIONAL PROFILE AND HISTORY: Problem focused assessment: Including review of records relating to presenting problem.  CLINICAL DECISION MAKING: LOW - limited treatment options, no task modification necessary  REHAB POTENTIAL: Excellent  EVALUATION COMPLEXITY: Low      PLAN:  OT FREQUENCY:  1x/week  OT DURATION: 4 weeks through 07/02/2024 and up to 5 total visits as needed   PLANNED INTERVENTIONS: 97535 self care/ADL training, 02889 therapeutic exercise, 97530 therapeutic activity, 97112 neuromuscular re-education, 97140 manual therapy, 97035 ultrasound, 97032 electrical stimulation (manual), 97760 Orthotic Initial, [id number] Orthotic/Prosthetic subsequent, compression bandaging, Dry needling, energy conservation, coping strategies training, and patient/family education  RECOMMENDED OTHER SERVICES: none now    CONSULTED AND AGREED WITH PLAN OF CARE: Patient  PLAN FOR NEXT SESSION:   Review initial HEP and recommendations    Melvenia Ada, OTR/L, CHT  06/01/2024, 5:22 PM   "

## 2024-06-01 ENCOUNTER — Encounter: Payer: Self-pay | Admitting: Rehabilitative and Restorative Service Providers"

## 2024-06-01 ENCOUNTER — Ambulatory Visit: Admitting: Rehabilitative and Restorative Service Providers"

## 2024-06-01 DIAGNOSIS — M25642 Stiffness of left hand, not elsewhere classified: Secondary | ICD-10-CM

## 2024-06-01 DIAGNOSIS — M25542 Pain in joints of left hand: Secondary | ICD-10-CM | POA: Diagnosis not present

## 2024-06-01 DIAGNOSIS — M25541 Pain in joints of right hand: Secondary | ICD-10-CM | POA: Diagnosis not present

## 2024-06-01 DIAGNOSIS — M6281 Muscle weakness (generalized): Secondary | ICD-10-CM | POA: Diagnosis not present

## 2024-06-01 DIAGNOSIS — M25641 Stiffness of right hand, not elsewhere classified: Secondary | ICD-10-CM | POA: Diagnosis not present

## 2024-06-01 NOTE — Patient Instructions (Signed)
" ° °  The goal is to help your hands last as long as possible, and hopefully  feel a bit better as well.  How you will be successful:  Change habits/routines, prevent degeneration and pain Exercise program to open joint spaces, relieve pressure and tension Protect with supportive bracing   Avoid:  Repetitive, heavy activities or prolonged postures (gripping steering wheel tightly) Things that hurt or cause your hands to swell Direct pressure on sore joints Holding stress and anxiety in your body (neck, shoulders, hands) Allowing joints to bend backwards or be in strange positions  Specifics:  Do a warm soak every morning for 3-5 minutes (or a heating pad/hot shower, etc.). This loosens up stiff hands/wrists.  Start every day with a non-painful stretch routine assigned by your therapist.  Stretch again, once or twice that day, thinking about the activities that you have planned.   It's smart to gently warmup hands and stretch before typically stressful activities (or before bed if your hands hurt in the night).  If you must do repetitive or stressful activities, use some type of support (arthritis gloves, Kinesio tape, braces, gardening gloves, etc.).  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 Ensure that handled objects have padding, non-slip grips or generally larger grips.  Take breaks for rest and recovery; don't carry too many bags at once or force yourself to finish a job.   Keep hands covered and warm in the winter or cool, rainy weather.  Anti-inflammatory medications or foods (tart cherry juice, turmeric spice, heathy oils & fats, etc.) can help with pain and swelling.  (Fried sugary and processed foods can make things worse.)  "

## 2024-06-15 ENCOUNTER — Encounter: Admitting: Rehabilitative and Restorative Service Providers"

## 2024-06-17 ENCOUNTER — Encounter: Payer: Self-pay | Admitting: Family Medicine

## 2024-06-17 NOTE — Telephone Encounter (Signed)
 Okay to send referral to general surgery for epigastric pain?

## 2024-06-29 ENCOUNTER — Encounter: Admitting: Rehabilitative and Restorative Service Providers"

## 2025-05-03 ENCOUNTER — Encounter: Admitting: Family Medicine
